# Patient Record
Sex: Male | Born: 1990 | Race: Black or African American | Hispanic: No | Marital: Single | Smoking: Former smoker
Health system: Southern US, Community
[De-identification: ages and names within clinical notes are randomized; demographics above are authoritative.]

---
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
## 2009-08-29 ENCOUNTER — Emergency Department: Payer: Self-pay | Admitting: Emergency Medicine

## 2012-03-25 ENCOUNTER — Emergency Department: Payer: Self-pay | Admitting: Emergency Medicine

## 2016-02-06 ENCOUNTER — Emergency Department: Payer: Self-pay

## 2016-02-06 ENCOUNTER — Encounter: Payer: Self-pay | Admitting: Emergency Medicine

## 2016-02-06 ENCOUNTER — Emergency Department
Admission: EM | Admit: 2016-02-06 | Discharge: 2016-02-06 | Disposition: A | Discharge status: Home / Self Care | Payer: Self-pay | Attending: Emergency Medicine | Admitting: Emergency Medicine

## 2016-02-06 DIAGNOSIS — F17210 Nicotine dependence, cigarettes, uncomplicated: Secondary | ICD-10-CM | POA: Insufficient documentation

## 2016-02-06 DIAGNOSIS — M25552 Pain in left hip: Secondary | ICD-10-CM | POA: Insufficient documentation

## 2016-02-06 DIAGNOSIS — G8929 Other chronic pain: Secondary | ICD-10-CM

## 2016-02-06 MED ORDER — ETODOLAC 400 MG PO TABS: 400.0000 mg | ORAL_TABLET | Freq: Two times a day (BID) | ORAL | Status: DC

## 2016-02-06 NOTE — ED Notes (Signed)
Having pain to left hip area for about 1 week  States pain is mainly at ant/lateral left hip area. States pain radiates occasionally to lower back and upper leg  Area non tender with palpation butpain increases with standing

## 2016-02-06 NOTE — ED Notes (Signed)
Pt to ed with c/o left hip pain x 1 1/2 weeks,  Progressively getting worse.  Pt denies injury.

## 2016-02-06 NOTE — Discharge Instructions (Signed)
Call and make an appointment with the orthopedist listed on your papers. Dr[facility]. Begin taking etodolac 400 mg twice a day with food. This to be taken daily.

## 2016-02-06 NOTE — ED Provider Notes (Signed)
[facility] Emergency Department Provider Note   ____________________________________________  Time seen: Approximately 11:37 AM  I have reviewed the triage vital signs and the nursing notes.   HISTORY  Chief Complaint Hip Pain   HPI Todd Wright is a 25 y.o. male who arrives to the emergency room with complaint of chronic L hip pain that has been exacerbated 1.5 weeks. He says he has anterior hip an that radiates down the front of the thigh and through to the buttocks. Pain began years ago but he says it was just uncomfortable but within the last 1.5 weeks has become almost unbearable. 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 Pain is exacerbated by continuous movement and walking and relieved by rest. He has not tried any medications for relief of pain or been seen by any other providers. Describes the pain as a sharp, aching pain that he feels is in his bone. Admits to some weakness with walking. Denies numbness, paraesthesias, loss of bowel or bladder function and nausea and vomiting. Currently he rates his pain as an 8 of 10.    History reviewed. No pertinent past medical history.  There are no active problems to display for this patient.   History reviewed. No pertinent past surgical history.  Current Outpatient Rx  Name  Route  Sig  Dispense  Refill  . etodolac (LODINE) 400 MG tablet   Oral   Take 1 tablet (400 mg total) by mouth 2 (two) times daily.   20 tablet   0     Allergies Review of patient's allergies indicates no known allergies.  History reviewed. No pertinent family history.  Social History Social History  Substance Use Topics  . Smoking status: Current Every Day Smoker    Types: Cigarettes  . Smokeless tobacco: None  . Alcohol Use: No    Review of Systems Constitutional: No fever/chills Cardiovascular:  Denies chest pain. Respiratory: Denies shortness of breath. Gastrointestinal: No abdominal pain.  No nausea, no vomiting.  Musculoskeletal: Negative for back pain.Positive for chronic left hip pain. Skin: Negative for rash. Neurological: Negative for headaches, focal weakness or numbness.  10-point ROS otherwise negative.  ____________________________________________   PHYSICAL EXAM:  VITAL SIGNS: ED Triage Vitals  Enc Vitals Group     BP 02/06/16 1100 140/70 mmHg     Pulse Rate 02/06/16 1100 87     Resp 02/06/16 1100 20     Temp 02/06/16 1100 98.2 F (36.8 C)     Temp Source 02/06/16 1100 Oral     SpO2 02/06/16 1100 98 %     Weight 02/06/16 1100 247 lb (112.038 kg)     Height 02/06/16 1100  (1.854 m)     Head Cir --      Peak Flow --      Pain Score 02/06/16 1059 8     Pain Loc --      Pain Edu? --      Excl. in GC? --     Constitutional: Alert and oriented. Well appearing and in no acute distress. Eyes: Conjunctivae are normal. PERRL. EOMI. Head: Atraumatic. Nose: No congestion/rhinnorhea. Neck: No stridor.   Cardiovascular: Normal rate, regular rhythm. Grossly normal heart sounds.  Good peripheral circulation. Respiratory: Normal respiratory effort.  No retractions. Lungs CTAB. Gastrointestinal: Soft and nontender. No distention.  Musculoskeletal: Examination of the left hip there is no  gross deformity. There is some soft tissue tenderness on palpation lateral aspect and slightly posteriorly but no difficulty with range of motion or crepitus was noted. There is no erythema or ecchymosis present. Patient is able to abduct and abduct. There is no swelling at the site. Neurologic:  Normal speech and language. No gross focal neurologic deficits are appreciated. No gait instability. Skin:  Skin is warm, dry and intact. No rash noted. Psychiatric: Mood and affect are normal. Speech and behavior are normal.  ____________________________________________   LABS (all  labs ordered are listed, but only abnormal results are displayed)  Labs Reviewed - No data to display  RADIOLOGY  X-ray of the left hip per radiologist was negative. I, Tommi Rumpshonda L Monserath Neff, personally viewed and evaluated these images (plain radiographs) as part of my medical decision making, as well as reviewing the written report by the radiologist. ____________________________________________   PROCEDURES  Procedure(s) performed: None  Critical Care performed: No  ____________________________________________   INITIAL IMPRESSION / ASSESSMENT AND PLAN / ED COURSE  Pertinent labs & imaging results that were available during my care of the patient were reviewed by me and considered in my medical decision making (see chart for details).  Patient was to follow up with Dr. Joice LoftsPoggi and information for contact was given to the patient. Patient was also discharged with prescription for etodolac 400 mg twice a day with food. Patient was encouraged to use ice to his hip as needed for pain and inflammation. Patient voiced that he needed a note to show his boss that he was in the emergency room. ____________________________________________   FINAL CLINICAL IMPRESSION(S) / ED DIAGNOSES  Final diagnoses:  Chronic left hip pain      NEW MEDICATIONS STARTED DURING THIS VISIT:  Discharge Medication List as of 02/06/2016  1:04 PM    START taking these medications   Details  etodolac (LODINE) 400 MG tablet Take 1 tablet (400 mg total) by mouth 2 (two) times daily., Starting 02/06/2016, Until Discontinued, Print         Note:  This document was prepared using Dragon voice recognition software and may include unintentional dictation errors.    Tommi Rumpshonda L Samier Jaco, PA-C 02/06/16 1315

## 2018-04-26 ENCOUNTER — Emergency Department
Admission: EM | Admit: 2018-04-26 | Discharge: 2018-04-26 | Disposition: A | Discharge status: Home / Self Care | Payer: Self-pay | Attending: Emergency Medicine | Admitting: Emergency Medicine

## 2018-04-26 ENCOUNTER — Encounter: Payer: Self-pay | Admitting: Emergency Medicine

## 2018-04-26 DIAGNOSIS — G510 Bell's palsy: Secondary | ICD-10-CM | POA: Insufficient documentation

## 2018-04-26 DIAGNOSIS — F17210 Nicotine dependence, cigarettes, uncomplicated: Secondary | ICD-10-CM | POA: Insufficient documentation

## 2018-04-26 DIAGNOSIS — Z79899 Other long term (current) drug therapy: Secondary | ICD-10-CM | POA: Insufficient documentation

## 2018-04-26 MED ORDER — PREDNISONE 10 MG PO TABS: 60.0000 mg | ORAL_TABLET | Freq: Every day | ORAL | 0 refills | Status: AC

## 2018-04-26 MED ORDER — ARTIFICIAL TEARS OPHTHALMIC OINT: TOPICAL_OINTMENT | Freq: Three times a day (TID) | OPHTHALMIC | 0 refills | Status: AC

## 2018-04-26 MED ORDER — VALACYCLOVIR HCL 1 G PO TABS: 1000.0000 mg | ORAL_TABLET | Freq: Three times a day (TID) | ORAL | 0 refills | Status: AC

## 2018-04-26 NOTE — ED Triage Notes (Signed)
Patient presents to the ED with numbness to the right side of his face x 2 days.  Smile is very uneven.  Patient is having difficulty closing his right eye.  Patient denies any weakness in his right arm or leg.

## 2018-04-26 NOTE — ED Provider Notes (Signed)
[facility] Emergency Department Provider Note   ____________________________________________    I have reviewed the triage vital signs and the nursing notes.   HISTORY  Chief Complaint Facial Droop     HPI Todd Wright is a 27 y.o. male who presents with right-sided facial droop.  Patient reports yesterday morning he woke up in the right side of his face "felt weird ".  He noted that he could not fully close his right eye and that the right side of his mouth was not equal when he smiled.  He denies headache.  No other neuro deficits.  No trauma.  No past medical history.  No history of high blood pressure.   History reviewed. No pertinent past medical history.  There are no active problems to display for this patient.   History reviewed. No pertinent surgical history.  Prior to Admission medications   Medication Sig Start Date End Date Taking? Authorizing Provider  artificial tears (LACRILUBE) OINT ophthalmic ointment Place into the right eye 3 (three) times daily for 14 days. 04/26/18 05/10/18  Jene Every, MD  etodolac (LODINE) 400 MG tablet Take 1 tablet (400 mg total) by mouth 2 (two) times daily. 02/06/16   Tommi Rumps, PA-C  predniSONE (DELTASONE) 10 MG tablet Take 6 tablets (60 mg total) by mouth daily with breakfast for 7 days. 04/26/18 05/03/18  Jene Every, MD  valACYclovir (VALTREX) 1000 MG tablet Take 1 tablet (1,000 mg total) by mouth 3 (three) times daily for 7 days. 04/26/18 05/03/18  Jene Every, MD     Allergies Patient has no known allergies.  No family history on file.  Social History Social History   Tobacco Use  . Smoking status: Current Every Day Smoker    Packs/day: 0.25    Types: Cigarettes  . Smokeless tobacco: Never Used  Substance Use Topics  . Alcohol use: No  . Drug use: No    Review of Systems  Constitutional: No fever/chills Eyes: No visual changes.  Difficulty closing eye fully ENT: As  above.   Gastrointestinal: No nausea, no vomiting.    Musculoskeletal: Negative for back pain. Skin: Negative for rash. Neurological: As above   ____________________________________________   PHYSICAL EXAM:  VITAL SIGNS: ED Triage Vitals [04/26/18 1726]  Enc Vitals Group     BP 135/68     Pulse Rate 97     Resp 18     Temp 98.8 F (37.1 C)     Temp Source Oral     SpO2 98 %     Weight 108.9 kg (240 lb)     Height 1.854 m (6\' 1" )     Head Circumference      Peak Flow      Pain Score 0     Pain Loc      Pain Edu?      Excl. in GC?     Constitutional: Alert and oriented.  Eyes: Conjunctivae are normal.  Ptosis of the right eye, mild   Mouth/Throat: Mucous membranes are moist.  No difficulty swallowing, pharynx normal  Cardiovascular: Normal rate, regular rhythm. Grossly normal heart sounds.  Good peripheral circulation. Respiratory: Normal respiratory effort.  No retractions.  Gastrointestinal: Soft and nontender. No distention.   Musculoskeletal: Warm and well perfused Neurologic:  Normal speech and language.  Right cranial nerve VII palsy, including forehead consistent with Bell's palsy Skin:  Skin is warm, dry and intact. No rash noted. Psychiatric: Mood and affect are  normal. Speech and behavior are normal.  ____________________________________________   LABS (all labs ordered are listed, but only abnormal results are displayed)  Labs Reviewed - No data to display ____________________________________________  EKG  None ____________________________________________  RADIOLOGY  None ____________________________________________   PROCEDURES  Procedure(s) performed: No  Procedures   Critical Care performed: No ____________________________________________   INITIAL IMPRESSION / ASSESSMENT AND PLAN / ED COURSE  Pertinent labs & imaging results that were available during my care of the patient were reviewed by me and considered in my medical  decision making (see chart for details).  Healthy 27 year old male with unremarkable vital signs presents with right-sided Bell's palsy.  No concerning additional neurological deficits or symptoms.  Will prescribe Lacri-Lube, Valtrex and steroids, outpatient follow-up with neurology    ____________________________________________   FINAL CLINICAL IMPRESSION(S) / ED DIAGNOSES  Final diagnoses:  Bell's palsy        Note:  This document was prepared using Dragon voice recognition software and may include unintentional dictation errors.    Jene Every, MD 04/26/18 [phone]

## 2018-06-09 ENCOUNTER — Encounter: Payer: Self-pay | Admitting: Emergency Medicine

## 2018-06-09 ENCOUNTER — Inpatient Hospital Stay
Admission: EM | Admit: 2018-06-09 | Discharge: 2018-06-10 | DRG: 896 | Disposition: A | Discharge status: Home / Self Care | Payer: Self-pay | Attending: Internal Medicine | Admitting: Internal Medicine

## 2018-06-09 ENCOUNTER — Emergency Department: Payer: Self-pay

## 2018-06-09 ENCOUNTER — Other Ambulatory Visit: Payer: Self-pay

## 2018-06-09 DIAGNOSIS — T50902A Poisoning by unspecified drugs, medicaments and biological substances, intentional self-harm, initial encounter: Secondary | ICD-10-CM

## 2018-06-09 DIAGNOSIS — F17210 Nicotine dependence, cigarettes, uncomplicated: Secondary | ICD-10-CM | POA: Diagnosis present

## 2018-06-09 DIAGNOSIS — T50904A Poisoning by unspecified drugs, medicaments and biological substances, undetermined, initial encounter: Secondary | ICD-10-CM | POA: Diagnosis present

## 2018-06-09 DIAGNOSIS — F1410 Cocaine abuse, uncomplicated: Secondary | ICD-10-CM

## 2018-06-09 DIAGNOSIS — F1210 Cannabis abuse, uncomplicated: Secondary | ICD-10-CM | POA: Diagnosis present

## 2018-06-09 DIAGNOSIS — E876 Hypokalemia: Secondary | ICD-10-CM | POA: Diagnosis present

## 2018-06-09 DIAGNOSIS — Z79899 Other long term (current) drug therapy: Secondary | ICD-10-CM

## 2018-06-09 DIAGNOSIS — F1994 Other psychoactive substance use, unspecified with psychoactive substance-induced mood disorder: Secondary | ICD-10-CM

## 2018-06-09 DIAGNOSIS — N179 Acute kidney failure, unspecified: Secondary | ICD-10-CM | POA: Diagnosis present

## 2018-06-09 DIAGNOSIS — Z599 Problem related to housing and economic circumstances, unspecified: Secondary | ICD-10-CM

## 2018-06-09 DIAGNOSIS — T50914A Poisoning by multiple unspecified drugs, medicaments and biological substances, undetermined, initial encounter: Secondary | ICD-10-CM | POA: Diagnosis present

## 2018-06-09 DIAGNOSIS — F1414 Cocaine abuse with cocaine-induced mood disorder: Principal | ICD-10-CM | POA: Diagnosis present

## 2018-06-09 DIAGNOSIS — F1010 Alcohol abuse, uncomplicated: Secondary | ICD-10-CM | POA: Diagnosis present

## 2018-06-09 DIAGNOSIS — T50901A Poisoning by unspecified drugs, medicaments and biological substances, accidental (unintentional), initial encounter: Secondary | ICD-10-CM | POA: Diagnosis present

## 2018-06-09 DIAGNOSIS — G92 Toxic encephalopathy: Secondary | ICD-10-CM | POA: Diagnosis present

## 2018-06-09 LAB — PROTIME-INR
INR: 0.95
Prothrombin Time: 12.6 seconds (ref 11.4–15.2)

## 2018-06-09 LAB — URINALYSIS, COMPLETE (UACMP) WITH MICROSCOPIC
BACTERIA UA: NONE SEEN
BILIRUBIN URINE: NEGATIVE
Glucose, UA: NEGATIVE mg/dL
Hgb urine dipstick: NEGATIVE
KETONES UR: 20 mg/dL — AB
Leukocytes, UA: NEGATIVE
Nitrite: NEGATIVE
Protein, ur: NEGATIVE mg/dL
Specific Gravity, Urine: 1.012 (ref 1.005–1.030)
pH: 6 (ref 5.0–8.0)

## 2018-06-09 LAB — CBC WITH DIFFERENTIAL/PLATELET
Abs Immature Granulocytes: 0.01 10*3/uL (ref 0.00–0.07)
BASOS ABS: 0 10*3/uL (ref 0.0–0.1)
Basophils Relative: 0 %
EOS ABS: 0.1 10*3/uL (ref 0.0–0.5)
EOS PCT: 1 %
HEMATOCRIT: 46.2 % (ref 39.0–52.0)
Hemoglobin: 15.1 g/dL (ref 13.0–17.0)
Immature Granulocytes: 0 %
LYMPHS ABS: 2 10*3/uL (ref 0.7–4.0)
Lymphocytes Relative: 26 %
MCH: 29 pg (ref 26.0–34.0)
MCHC: 32.7 g/dL (ref 30.0–36.0)
MCV: 88.8 fL (ref 80.0–100.0)
Monocytes Absolute: 0.7 10*3/uL (ref 0.1–1.0)
Monocytes Relative: 9 %
NRBC: 0 % (ref 0.0–0.2)
Neutro Abs: 4.8 10*3/uL (ref 1.7–7.7)
Neutrophils Relative %: 64 %
Platelets: 246 10*3/uL (ref 150–400)
RBC: 5.2 MIL/uL (ref 4.22–5.81)
RDW: 13.8 % (ref 11.5–15.5)
WBC: 7.5 10*3/uL (ref 4.0–10.5)

## 2018-06-09 LAB — URINE DRUG SCREEN, QUALITATIVE (ARMC ONLY)
AMPHETAMINES, UR SCREEN: POSITIVE — AB
BENZODIAZEPINE, UR SCRN: NOT DETECTED
Barbiturates, Ur Screen: NOT DETECTED
COCAINE METABOLITE, UR ~~LOC~~: POSITIVE — AB
Cannabinoid 50 Ng, Ur ~~LOC~~: POSITIVE — AB
MDMA (Ecstasy)Ur Screen: NOT DETECTED
Methadone Scn, Ur: NOT DETECTED
OPIATE, UR SCREEN: NOT DETECTED
PHENCYCLIDINE (PCP) UR S: NOT DETECTED
Tricyclic, Ur Screen: NOT DETECTED

## 2018-06-09 LAB — BASIC METABOLIC PANEL
Anion gap: 12 (ref 5–15)
BUN: 8 mg/dL (ref 6–20)
CALCIUM: 9.3 mg/dL (ref 8.9–10.3)
CO2: 25 mmol/L (ref 22–32)
CREATININE: 1.49 mg/dL — AB (ref 0.61–1.24)
Chloride: 101 mmol/L (ref 98–111)
GFR calc non Af Amer: >60 mL/min (ref >60)
Glucose, Bld: 105 mg/dL — ABNORMAL HIGH (ref 70–99)
Potassium: 3.3 mmol/L — ABNORMAL LOW (ref 3.5–5.1)
SODIUM: 138 mmol/L (ref 135–145)

## 2018-06-09 LAB — ETHANOL: Alcohol, Ethyl (B): <10 mg/dL (ref <10)

## 2018-06-09 LAB — HEPATIC FUNCTION PANEL
ALK PHOS: 66 U/L (ref 38–126)
ALT: 21 U/L (ref 0–44)
AST: 32 U/L (ref 15–41)
Albumin: 4.4 g/dL (ref 3.5–5.0)
BILIRUBIN INDIRECT: 1.2 mg/dL — AB (ref 0.3–0.9)
Bilirubin, Direct: 0.1 mg/dL (ref 0.0–0.2)
TOTAL PROTEIN: 7.8 g/dL (ref 6.5–8.1)
Total Bilirubin: 1.3 mg/dL — ABNORMAL HIGH (ref 0.3–1.2)

## 2018-06-09 LAB — ACETAMINOPHEN LEVEL
Acetaminophen (Tylenol), Serum: <10 ug/mL — ABNORMAL LOW (ref 10–30)
Acetaminophen (Tylenol), Serum: <10 ug/mL — ABNORMAL LOW (ref 10–30)

## 2018-06-09 LAB — TROPONIN I: Troponin I: <0.03 ng/mL (ref <0.03)

## 2018-06-09 LAB — SALICYLATE LEVEL: Salicylate Lvl: <7 mg/dL (ref 2.8–30.0)

## 2018-06-09 MED ORDER — DEXTROSE 5 % IV SOLN
15.0000 mg/kg/h | INTRAVENOUS | Status: DC
  Administered 2018-06-09: 15 mg/kg/h via INTRAVENOUS
  Filled 2018-06-09 (×2): qty 200

## 2018-06-09 MED ORDER — ACETYLCYSTEINE LOAD VIA INFUSION
150.0000 mg/kg | Freq: Once | INTRAVENOUS | Status: AC
  Administered 2018-06-09: 15750 mg via INTRAVENOUS
  Filled 2018-06-09: qty 394

## 2018-06-09 MED ORDER — DIPHENHYDRAMINE HCL 50 MG/ML IJ SOLN
50.0000 mg | Freq: Once | INTRAMUSCULAR | Status: AC
  Administered 2018-06-09: 50 mg via INTRAVENOUS

## 2018-06-09 MED ORDER — DIPHENHYDRAMINE HCL 50 MG/ML IJ SOLN
INTRAMUSCULAR | Status: AC
  Filled 2018-06-09: qty 1

## 2018-06-09 MED ORDER — LORAZEPAM 2 MG/ML IJ SOLN
2.0000 mg | Freq: Once | INTRAMUSCULAR | Status: AC
  Administered 2018-06-09: 2 mg via INTRAVENOUS

## 2018-06-09 MED ORDER — SODIUM CHLORIDE 0.9 % IV BOLUS
1000.0000 mL | Freq: Once | INTRAVENOUS | Status: AC
  Administered 2018-06-09: 1000 mL via INTRAVENOUS

## 2018-06-09 NOTE — ED Notes (Signed)
Pt returned from CT °

## 2018-06-09 NOTE — ED Notes (Signed)
Pt went to CT accompanied by security and RN.
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
## 2018-06-09 NOTE — ED Notes (Addendum)
Pt noted with itching and hives.  Dr Marisa Severin notified. Mucomyst paused until MD eval. Pt very agitated at this time.

## 2018-06-09 NOTE — ED Notes (Signed)
Per dr Marisa Severin will remain to leave mucomyst paused until repeat tylenol level.

## 2018-06-09 NOTE — BH Assessment (Signed)
Assessment Note  Todd Wright is an 27 y.o. male.   Diagnosis: Overdose  Past Medical History: History reviewed. No pertinent past medical history.  History reviewed. No pertinent surgical history.  Family History: History reviewed. No pertinent family history.  Social History:  reports that he has been smoking cigarettes. He has been smoking about 0.25 packs per day. He has never used smokeless tobacco. He reports that he does not drink alcohol or use drugs.  Additional Social History:     CIWA: CIWA-Ar BP: 125/70 Pulse Rate: 82 COWS:    Allergies: No Known Allergies  Home Medications:  (Not in a hospital admission)  OB/GYN Status:  No LMP for male patient.  General Assessment Data Assessment unable to be completed: Yes Reason for not completing assessment: Pt here for an overdose and is not yet medically cleared or appropriate for assessment                                                  Advance Directives (For Healthcare) Does Patient Have a Medical Advance Directive?: (uta)          Disposition: No disposition at this time due to pt not being medically cleared.     On Site Evaluation by:   Reviewed with Physician:    Shylynn Bruning D Nyhla Mountjoy 06/09/2018 9:20 PM
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
## 2018-06-09 NOTE — ED Notes (Signed)
In and out cath by stephen RN with this RN, sitter, and lisa NT at bedside as well as security. Sterile technique maintained

## 2018-06-09 NOTE — ED Notes (Signed)
Eyes rolled back and body have mild twitching similar to seizure like activity. Dr Marisa Severin at bedside.
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
## 2018-06-10 ENCOUNTER — Other Ambulatory Visit: Payer: Self-pay

## 2018-06-10 DIAGNOSIS — F1994 Other psychoactive substance use, unspecified with psychoactive substance-induced mood disorder: Secondary | ICD-10-CM

## 2018-06-10 DIAGNOSIS — F1410 Cocaine abuse, uncomplicated: Secondary | ICD-10-CM

## 2018-06-10 LAB — CBC
HCT: 44.5 % (ref 39.0–52.0)
Hemoglobin: 14.2 g/dL (ref 13.0–17.0)
MCH: 29 pg (ref 26.0–34.0)
MCHC: 31.9 g/dL (ref 30.0–36.0)
MCV: 90.8 fL (ref 80.0–100.0)
NRBC: 0 % (ref 0.0–0.2)
PLATELETS: 238 10*3/uL (ref 150–400)
RBC: 4.9 MIL/uL (ref 4.22–5.81)
RDW: 13.6 % (ref 11.5–15.5)
WBC: 8.4 10*3/uL (ref 4.0–10.5)

## 2018-06-10 LAB — BASIC METABOLIC PANEL
Anion gap: 11 (ref 5–15)
BUN: 8 mg/dL (ref 6–20)
CALCIUM: 8.5 mg/dL — AB (ref 8.9–10.3)
CO2: 26 mmol/L (ref 22–32)
CREATININE: 1.09 mg/dL (ref 0.61–1.24)
Chloride: 104 mmol/L (ref 98–111)
GFR calc non Af Amer: >60 mL/min (ref >60)
Glucose, Bld: 77 mg/dL (ref 70–99)
Potassium: 3.3 mmol/L — ABNORMAL LOW (ref 3.5–5.1)
Sodium: 141 mmol/L (ref 135–145)

## 2018-06-10 MED ORDER — LORAZEPAM 2 MG/ML IJ SOLN: 2.0000 mg | INTRAMUSCULAR | Status: DC | PRN

## 2018-06-10 MED ORDER — POTASSIUM CHLORIDE CRYS ER 20 MEQ PO TBCR
40.0000 meq | EXTENDED_RELEASE_TABLET | Freq: Once | ORAL | Status: AC
  Administered 2018-06-10: 40 meq via ORAL
  Filled 2018-06-10: qty 2

## 2018-06-10 MED ORDER — ADULT MULTIVITAMIN W/MINERALS CH
1.0000 | ORAL_TABLET | Freq: Every day | ORAL | Status: DC
  Administered 2018-06-10: 1 via ORAL
  Filled 2018-06-10: qty 1

## 2018-06-10 MED ORDER — LORAZEPAM 2 MG/ML IJ SOLN: 1.0000 mg | Freq: Four times a day (QID) | INTRAMUSCULAR | Status: DC | PRN

## 2018-06-10 MED ORDER — FOLIC ACID 1 MG PO TABS
1.0000 mg | ORAL_TABLET | Freq: Every day | ORAL | Status: DC
  Administered 2018-06-10: 1 mg via ORAL
  Filled 2018-06-10: qty 1

## 2018-06-10 MED ORDER — LORAZEPAM 2 MG/ML IJ SOLN
0.0000 mg | Freq: Four times a day (QID) | INTRAMUSCULAR | Status: DC
  Administered 2018-06-10: 2 mg via INTRAVENOUS
  Filled 2018-06-10: qty 1

## 2018-06-10 MED ORDER — THIAMINE HCL 100 MG/ML IJ SOLN: 100.0000 mg | Freq: Every day | INTRAMUSCULAR | Status: DC

## 2018-06-10 MED ORDER — ONDANSETRON HCL 4 MG/2ML IJ SOLN: 4.0000 mg | Freq: Four times a day (QID) | INTRAMUSCULAR | Status: DC | PRN

## 2018-06-10 MED ORDER — LORAZEPAM 2 MG/ML IJ SOLN: 0.0000 mg | Freq: Two times a day (BID) | INTRAMUSCULAR | Status: DC

## 2018-06-10 MED ORDER — LORAZEPAM 1 MG PO TABS: 1.0000 mg | ORAL_TABLET | Freq: Four times a day (QID) | ORAL | Status: DC | PRN

## 2018-06-10 MED ORDER — VITAMIN B-1 100 MG PO TABS
100.0000 mg | ORAL_TABLET | Freq: Every day | ORAL | Status: DC
  Administered 2018-06-10: 100 mg via ORAL
  Filled 2018-06-10: qty 1

## 2018-06-10 MED ORDER — SODIUM CHLORIDE 0.9 % IV SOLN
INTRAVENOUS | Status: AC
  Administered 2018-06-10: 01:00:00 via INTRAVENOUS

## 2018-06-10 MED ORDER — ENOXAPARIN SODIUM 40 MG/0.4ML ~~LOC~~ SOLN
40.0000 mg | SUBCUTANEOUS | Status: DC
  Administered 2018-06-10: 40 mg via SUBCUTANEOUS
  Filled 2018-06-10: qty 0.4

## 2018-06-10 MED ORDER — ONDANSETRON HCL 4 MG PO TABS: 4.0000 mg | ORAL_TABLET | Freq: Four times a day (QID) | ORAL | Status: DC | PRN

## 2018-06-10 NOTE — Progress Notes (Signed)
Paged Dr. Toni Amend twice, also text page about patient wanting to leava AMA. Waiting for MD to call back. RN will continue to monitor.
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
## 2018-06-10 NOTE — Progress Notes (Signed)
Patient was transferred from the ER accompanied by the care RN., NT and a bedside aide and a Emergency planning/management officer. On admission to the unit patient was somnolent and was unable to provide any admission documentation. Patient was placed on acardiac monitor,placed on suicide precaution with aide at the bedside.

## 2018-06-10 NOTE — Consult Note (Signed)
Psychiatry: Patient refused interview.  Will attempt later.

## 2018-06-10 NOTE — Progress Notes (Signed)
Dr. Toni Amend seen patient. MD clear patient to be discharge, no new medication was started and advice patient to see PCP within a week. Discontinue peripheral IV and telemetry monitor. Katie, NT transport patient.
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
## 2018-06-10 NOTE — ED Notes (Signed)
All belongings sent to floor.

## 2018-06-10 NOTE — Plan of Care (Signed)
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
## 2018-06-11 LAB — HIV ANTIBODY (ROUTINE TESTING W REFLEX): HIV Screen 4th Generation wRfx: NONREACTIVE

## 2019-01-25 ENCOUNTER — Emergency Department
Admission: EM | Admit: 2019-01-25 | Discharge: 2019-01-25 | Disposition: A | Discharge status: Home / Self Care | Payer: Self-pay | Attending: Emergency Medicine | Admitting: Emergency Medicine

## 2019-01-25 ENCOUNTER — Other Ambulatory Visit: Payer: Self-pay

## 2019-01-25 ENCOUNTER — Encounter: Payer: Self-pay | Admitting: Emergency Medicine

## 2019-01-25 DIAGNOSIS — F17210 Nicotine dependence, cigarettes, uncomplicated: Secondary | ICD-10-CM | POA: Insufficient documentation

## 2019-01-25 DIAGNOSIS — F1010 Alcohol abuse, uncomplicated: Secondary | ICD-10-CM | POA: Insufficient documentation

## 2019-01-25 DIAGNOSIS — F1994 Other psychoactive substance use, unspecified with psychoactive substance-induced mood disorder: Secondary | ICD-10-CM | POA: Insufficient documentation

## 2019-01-25 DIAGNOSIS — F1410 Cocaine abuse, uncomplicated: Secondary | ICD-10-CM | POA: Insufficient documentation

## 2019-01-25 DIAGNOSIS — Z1159 Encounter for screening for other viral diseases: Secondary | ICD-10-CM | POA: Insufficient documentation

## 2019-01-25 LAB — COMPREHENSIVE METABOLIC PANEL
ALT: 37 U/L (ref 0–44)
AST: 23 U/L (ref 15–41)
Albumin: 3.6 g/dL (ref 3.5–5.0)
Alkaline Phosphatase: 80 U/L (ref 38–126)
Anion gap: 7 (ref 5–15)
BUN: 12 mg/dL (ref 6–20)
CO2: 26 mmol/L (ref 22–32)
Calcium: 8.4 mg/dL — ABNORMAL LOW (ref 8.9–10.3)
Chloride: 109 mmol/L (ref 98–111)
Creatinine, Ser: 1.06 mg/dL (ref 0.61–1.24)
GFR calc Af Amer: >60 mL/min (ref >60)
GFR calc non Af Amer: >60 mL/min (ref >60)
Glucose, Bld: 97 mg/dL (ref 70–99)
Potassium: 3.4 mmol/L — ABNORMAL LOW (ref 3.5–5.1)
Sodium: 142 mmol/L (ref 135–145)
Total Bilirubin: 0.7 mg/dL (ref 0.3–1.2)
Total Protein: 6.6 g/dL (ref 6.5–8.1)

## 2019-01-25 LAB — URINE DRUG SCREEN, QUALITATIVE (ARMC ONLY)
Amphetamines, Ur Screen: NOT DETECTED
Barbiturates, Ur Screen: NOT DETECTED
Benzodiazepine, Ur Scrn: NOT DETECTED
Cannabinoid 50 Ng, Ur ~~LOC~~: POSITIVE — AB
Cocaine Metabolite,Ur ~~LOC~~: POSITIVE — AB
MDMA (Ecstasy)Ur Screen: NOT DETECTED
Methadone Scn, Ur: NOT DETECTED
Opiate, Ur Screen: NOT DETECTED
Phencyclidine (PCP) Ur S: NOT DETECTED
Tricyclic, Ur Screen: NOT DETECTED

## 2019-01-25 LAB — CBC
HCT: 41.6 % (ref 39.0–52.0)
Hemoglobin: 13.5 g/dL (ref 13.0–17.0)
MCH: 28.8 pg (ref 26.0–34.0)
MCHC: 32.5 g/dL (ref 30.0–36.0)
MCV: 88.7 fL (ref 80.0–100.0)
Platelets: 221 10*3/uL (ref 150–400)
RBC: 4.69 MIL/uL (ref 4.22–5.81)
RDW: 13 % (ref 11.5–15.5)
WBC: 5.8 10*3/uL (ref 4.0–10.5)
nRBC: 0 % (ref 0.0–0.2)

## 2019-01-25 LAB — SARS CORONAVIRUS 2 BY RT PCR (HOSPITAL ORDER, PERFORMED IN ~~LOC~~ HOSPITAL LAB): SARS Coronavirus 2: NEGATIVE

## 2019-01-25 LAB — ETHANOL: Alcohol, Ethyl (B): <10 mg/dL (ref <10)

## 2019-01-25 NOTE — ED Notes (Signed)
TTS at bedside. 

## 2019-01-25 NOTE — BH Assessment (Signed)
Patient signed "Release of Information" for [employer] spoke with "Brother Nyoka Cowden" about the information he needed to be cleared to be admitted to their program. ER MD (Dr. Jimmye Norman) had needed labs order.

## 2019-01-25 NOTE — ED Notes (Signed)
Pt sitting in bed in NAD speaking with this RN, A&O x4. PT reports his last drink was yesterday evening and his last cocaine use was around the same time. Pt reports he usually drinks about an 18 pk of beer daily and uses cocaine almost everytime he drinks.

## 2019-01-25 NOTE — Discharge Instructions (Addendum)
You have been seen in the emergency department for a  psychiatric concern. You have been evaluated both medically as well as psychiatrically. Please follow-up with your outpatient resources provided. Return to the emergency department for any worsening symptoms, or any thoughts of hurting yourself or anyone else so that we may attempt to help you. 

## 2019-01-25 NOTE — BH Assessment (Signed)
Assessment Note  Todd Wright is an 28 y.o. male who presents to the ER after he was advised to do so by treatment facility, [facility], to be medically cleared and screened for COVID-19. Patient reports of abusing cannabis, cocaine and alcohol. He states he used on a daily basis in varying amounts. He further reports, the last seven months have been the worse and it has gotten to the point he is unable to manage it. His use have affected his relationship with his family and friends, as well as his job.  Patient denies history of treatment for both substance abuse and mental health needs. This is the first time seeking help.  During the interview, the patient was calm, cooperative and pleasant. He was able to provide appropriate answers to the questions. He also denies involvement with the legal system and history of violence and aggression. Patient denies SI/HI and AV/H.  Diagnosis: Substance Abuse Disorder  Past Medical History: History reviewed. No pertinent past medical history.  History reviewed. No pertinent surgical history.  Family History: No family history on file.  Social History:  reports that he has been smoking cigarettes. He has been smoking about 0.25 packs per day. He has never used smokeless tobacco. He reports current alcohol use. He reports current drug use. Drug: Cocaine.  Additional Social History:  Alcohol / Drug Use Pain Medications: See PTA Prescriptions: See PTA Over the Counter: See PTA Longest period of sobriety (when/how long): "Three year" Negative Consequences of Use: Personal relationships, Work / Youth worker, Museum/gallery curator Substance #1 Name of Substance 1: Cannabis 1 - Age of First Use: 12 1 - Amount (size/oz): Unable to quantify 1 - Frequency: Daily 1 - Duration: Unable to quantify 1 - Last Use / Amount: 01/24/2019 Substance #2 Name of Substance 2: Cocaine 2 - Age of First Use: 27 2 - Amount (size/oz): "About a gram" 2 - Frequency: 4 to 5  times a week 2 - Duration: "I started about seven months ago" 2 - Last Use / Amount: 01/24/2019 Substance #3 Name of Substance 3: Alcohol  3 - Age of First Use: 01/24/2019 3 - Amount (size/oz): "About 18 pack" 3 - Frequency: Daily 3 - Duration: Unable to quantify 3 - Last Use / Amount: 01/24/2019  CIWA: CIWA-Ar BP: (!) 123/57 Pulse Rate: 76 Nausea and Vomiting: no nausea and no vomiting Tactile Disturbances: none Tremor: no tremor Auditory Disturbances: not present Paroxysmal Sweats: no sweat visible Visual Disturbances: not present Anxiety: no anxiety, at ease Headache, Fullness in Head: none present Agitation: normal activity Orientation and Clouding of Sensorium: oriented and can do serial additions CIWA-Ar Total: 0 COWS: Clinical Opiate Withdrawal Scale (COWS) Resting Pulse Rate: Pulse Rate 80 or below Sweating: No report of chills or flushing Restlessness: Able to sit still Pupil Size: Pupils pinned or normal size for room light Bone or Joint Aches: Not present Runny Nose or Tearing: Not present GI Upset: No GI symptoms Tremor: No tremor Yawning: No yawning Anxiety or Irritability: None Gooseflesh Skin: Skin is smooth COWS Total Score: 0  Allergies: No Known Allergies  Home Medications: (Not in a hospital admission)   OB/GYN Status:  No LMP for male patient.  General Assessment Data Location of Assessment: [facility] ED TTS Assessment: In system Is this a Tele or Face-to-Face Assessment?: Face-to-Face Is this an Initial Assessment or a Re-assessment for this encounter?: Initial Assessment Language Other than English: No Living Arrangements: Other (Comment)(Private Home) What gender do you identify as?: Male Marital  status: Single Pregnancy Status: No Living Arrangements: Other relatives Can pt return to current living arrangement?: Yes Admission Status: Voluntary Is patient capable of signing voluntary admission?: Yes Referral Source:  Self/Family/Friend Insurance type: None  Medical Screening Exam [facility] Walk-in ONLY) Medical Exam completed: Yes  Crisis Care Plan Living Arrangements: Other relatives Legal Guardian: Other:(Self) Name of Psychiatrist: Reports of none Name of Therapist: Reports of none  Education Status Is patient currently in school?: No Is the patient employed, unemployed or receiving disability?: Unemployed  Risk to self with the past 6 months Suicidal Ideation: No Has patient been a risk to self within the past 6 months prior to admission? : No Suicidal Intent: No Has patient had any suicidal intent within the past 6 months prior to admission? : No Is patient at risk for suicide?: No Suicidal Plan?: No Has patient had any suicidal plan within the past 6 months prior to admission? : No Access to Means: No What has been your use of drugs/alcohol within the last 12 months?: Alcohol, Cannabis & Cocaine Previous Attempts/Gestures: No How many times?: 0 Other Self Harm Risks: Active addiction Triggers for Past Attempts: None known Intentional Self Injurious Behavior: None Family Suicide History: No Recent stressful life event(s): Other (Comment), Loss (Comment)(Increase drug use) Persecutory voices/beliefs?: No Depression: Yes Depression Symptoms: Guilt, Insomnia, Isolating, Feeling worthless/self pity Substance abuse history and/or treatment for substance abuse?: Yes Suicide prevention information given to non-admitted patients: Not applicable  Risk to Others within the past 6 months Homicidal Ideation: No Does patient have any lifetime risk of violence toward others beyond the six months prior to admission? : No Thoughts of Harm to Others: No Current Homicidal Intent: No Current Homicidal Plan: No Access to Homicidal Means: No Identified Victim: Reports of none History of harm to others?: No Assessment of Violence: None Noted Violent Behavior Description: Reports of none Does patient  have access to weapons?: No Criminal Charges Pending?: No Does patient have a court date: No Is patient on probation?: No  Psychosis Hallucinations: None noted Delusions: None noted  Mental Status Report Appearance/Hygiene: Unremarkable, In scrubs Eye Contact: Good Motor Activity: Freedom of movement, Unremarkable Speech: Logical/coherent, Unremarkable Level of Consciousness: Alert Mood: Guilty, Sad, Pleasant Affect: Appropriate to circumstance, Depressed Anxiety Level: None Thought Processes: Coherent, Relevant Judgement: Unimpaired Orientation: Person, Place, Time, Situation, Appropriate for developmental age Obsessive Compulsive Thoughts/Behaviors: None  Cognitive Functioning Concentration: Normal Memory: Recent Intact, Remote Intact Is patient IDD: No Insight: Fair Impulse Control: Fair Appetite: Good Have you had any weight changes? : No Change Sleep: Decreased Total Hours of Sleep: 2 Vegetative Symptoms: None  ADLScreening [facility] Assessment Services) Patient's cognitive ability adequate to safely complete daily activities?: Yes Patient able to express need for assistance with ADLs?: Yes Independently performs ADLs?: Yes (appropriate for developmental age)  Prior Inpatient Therapy Prior Inpatient Therapy: No  Prior Outpatient Therapy Prior Outpatient Therapy: No Does patient have an ACCT team?: No Does patient have Intensive In-House Services?  : No Does patient have [facility] services? : No Does patient have P4CC services?: No  ADL Screening (condition at time of admission) Patient's cognitive ability adequate to safely complete daily activities?: Yes Is the patient deaf or have difficulty hearing?: No Does the patient have difficulty seeing, even when wearing glasses/contacts?: No Does the patient have difficulty concentrating, remembering, or making decisions?: No Patient able to express need for assistance with ADLs?: Yes Does the patient have difficulty  dressing or bathing?: No Independently performs ADLs?: Yes (  appropriate for developmental age) Does the patient have difficulty walking or climbing stairs?: No Weakness of Legs: None Weakness of Arms/Hands: None  Home Assistive Devices/Equipment Home Assistive Devices/Equipment: None  Therapy Consults (therapy consults require a physician order) PT Evaluation Needed: No OT Evalulation Needed: No SLP Evaluation Needed: No Abuse/Neglect Assessment (Assessment to be complete while patient is alone) Abuse/Neglect Assessment Can Be Completed: Yes Physical Abuse: Denies Verbal Abuse: Denies Sexual Abuse: Denies Exploitation of patient/patient's resources: Denies Self-Neglect: Denies Values / Beliefs Cultural Requests During Hospitalization: None Spiritual Requests During Hospitalization: None Consults Spiritual Care Consult Needed: No Social Work Consult Needed: No Merchant navy officerAdvance Directives (For Healthcare) Does Patient Have a Medical Advance Directive?: No       Child/Adolescent Assessment Running Away Risk: Denies(Patient is an adult)  Disposition:  Disposition Initial Assessment Completed for this Encounter: Yes  On Site Evaluation by:   Reviewed with Physician:    Lilyan Gilfordalvin J. Aiyden Lauderback MS, LCAS, [facility], NCC, CCSI Therapeutic Triage Specialist 01/25/2019 5:08 PM

## 2019-01-25 NOTE — ED Provider Notes (Signed)
Patient has been seen by psychiatry.  Patient is safe for discharge from the emergency department.  Arrange for long-term treatment for the patient, he has his lab work and hand to bring for his intake.  Patient's medical work-up largely nonrevealing besides cannabinoid and cocaine positive urine toxicology.   Harvest Dark, MD 01/25/19 [phone]

## 2019-01-25 NOTE — ED Provider Notes (Signed)
[facility] Emergency Department Provider Note       Time seen: ----------------------------------------- 1:55 PM on 01/25/2019 -----------------------------------------   I have reviewed the triage vital signs and the nursing notes.  HISTORY   Chief Complaint Psychiatric Evaluation    HPI Todd Wright is a 28 y.o. male with a history of substance-induced mood disorder, cocaine abuse, drug overdose, acute kidney injury who presents to the ED for requesting detox from alcohol and cocaine.  Patient states he last drank and used cocaine yesterday.  He has never been to treatment before he states, denies recent illness or other complaints.  History reviewed. No pertinent past medical history.  Patient Active Problem List   Diagnosis Date Noted  . Substance induced mood disorder ([place]) 06/10/2018  . Cocaine abuse ([place]) 06/10/2018  . Drug overdose, multiple drugs, undetermined intent, initial encounter 06/09/2018  . AKI (acute kidney injury) ([place]) 06/09/2018  . Drug overdose 06/09/2018    History reviewed. No pertinent surgical history.  Allergies Patient has no known allergies.  Social History Social History   Tobacco Use  . Smoking status: Current Every Day Smoker    Packs/day: 0.25    Types: Cigarettes  . Smokeless tobacco: Never Used  Substance Use Topics  . Alcohol use: Yes  . Drug use: Yes    Types: Cocaine   Review of Systems Constitutional: Negative for fever. Cardiovascular: Negative for chest pain. Respiratory: Negative for shortness of breath. Gastrointestinal: Negative for abdominal pain, vomiting and diarrhea. Musculoskeletal: Negative for back pain. Skin: Negative for rash. Neurological: Negative for headaches, focal weakness or numbness. Psychiatric: Positive for substance abuse  All systems negative/normal/unremarkable except as stated in the HPI  ____________________________________________   PHYSICAL  EXAM:  VITAL SIGNS: ED Triage Vitals  Enc Vitals Group     BP 01/25/19 1325 (!) 124/53     Pulse Rate 01/25/19 1325 81     Resp --      Temp 01/25/19 1325 98.6 F (37 C)     Temp Source 01/25/19 1325 Oral     SpO2 01/25/19 1325 99 %     Weight 01/25/19 1321 210 lb (95.3 kg)     Height 01/25/19 1321 6\' 1"  (1.854 m)     Head Circumference --      Peak Flow --      Pain Score 01/25/19 1321 0     Pain Loc --      Pain Edu? --      Excl. in [place]? --    Constitutional: Alert and oriented. Well appearing and in no distress. Eyes: Conjunctivae are normal. Normal extraocular movements. Cardiovascular: Normal rate, regular rhythm. No murmurs, rubs, or gallops. Respiratory: Normal respiratory effort without tachypnea nor retractions. Breath sounds are clear and equal bilaterally. No wheezes/rales/rhonchi. Gastrointestinal: Soft and nontender. Normal bowel sounds Musculoskeletal: Nontender with normal range of motion in extremities. No lower extremity tenderness nor edema. Neurologic:  Normal speech and language. No gross focal neurologic deficits are appreciated.  Skin:  Skin is warm, dry and intact. No rash noted. Psychiatric: Mood and affect are normal. Speech and behavior are normal.  ____________________________________________  ED COURSE:  As part of my medical decision making, I reviewed the following data within the [place] History obtained from family if available, nursing notes, old chart and ekg, as well as notes from prior ED visits. Patient presented for substance abuse requesting detox, we will assess with labs and imaging as indicated at this  time.   Procedures  Todd Wright was evaluated in Emergency Department on 01/25/2019 for the symptoms described in the history of present illness. He was evaluated in the context of the global COVID-19 pandemic, which necessitated consideration that the patient might be at risk for infection with the SARS-CoV-2  virus that causes COVID-19. Institutional protocols and algorithms that pertain to the evaluation of patients at risk for COVID-19 are in a state of rapid change based on information released by regulatory bodies including the CDC and federal and state organizations. These policies and algorithms were followed during the patient's care in the ED.  ____________________________________________   LABS (pertinent positives/negatives)  Labs Reviewed  SARS CORONAVIRUS 2 (HOSPITAL ORDER, PERFORMED IN [facility])  CBC  COMPREHENSIVE METABOLIC PANEL  ETHANOL  URINE DRUG SCREEN, QUALITATIVE ([facility] ONLY)   ____________________________________________   DIFFERENTIAL DIAGNOSIS   Alcohol abuse, cocaine abuse, depression  FINAL ASSESSMENT AND PLAN  Alcohol and cocaine abuse   Plan: The patient had presented for detox. Patient's labs are reassuring.  He appears medically clear for psychiatric evaluation and disposition.   Todd Wright , MD    Note: This note was generated in part or whole with voice recognition software. Voice recognition is usually quite accurate but there are transcription errors that can and very often do occur. I apologize for any typographical errors that were not detected and corrected.     Todd Wright,  E, MD 01/25/19 1356

## 2019-01-25 NOTE — ED Triage Notes (Signed)
Says he wants detox from etoh--last yesterday and cocaine.  He is alert and in nad, calm and coopertive with good eye contact.  Has never been in treatment before.

## 2019-02-10 ENCOUNTER — Other Ambulatory Visit: Payer: Self-pay

## 2019-02-10 ENCOUNTER — Emergency Department
Admission: EM | Admit: 2019-02-10 | Discharge: 2019-02-10 | Disposition: A | Discharge status: Home / Self Care | Payer: Self-pay | Attending: Emergency Medicine | Admitting: Emergency Medicine

## 2019-02-10 ENCOUNTER — Encounter: Payer: Self-pay | Admitting: Emergency Medicine

## 2019-02-10 DIAGNOSIS — Y999 Unspecified external cause status: Secondary | ICD-10-CM | POA: Insufficient documentation

## 2019-02-10 DIAGNOSIS — X500XXA Overexertion from strenuous movement or load, initial encounter: Secondary | ICD-10-CM | POA: Insufficient documentation

## 2019-02-10 DIAGNOSIS — S39012A Strain of muscle, fascia and tendon of lower back, initial encounter: Secondary | ICD-10-CM | POA: Insufficient documentation

## 2019-02-10 DIAGNOSIS — Y929 Unspecified place or not applicable: Secondary | ICD-10-CM | POA: Insufficient documentation

## 2019-02-10 DIAGNOSIS — Y9389 Activity, other specified: Secondary | ICD-10-CM | POA: Insufficient documentation

## 2019-02-10 DIAGNOSIS — F17210 Nicotine dependence, cigarettes, uncomplicated: Secondary | ICD-10-CM | POA: Insufficient documentation

## 2019-02-10 MED ORDER — METHOCARBAMOL 500 MG PO TABS
1000.0000 mg | ORAL_TABLET | Freq: Once | ORAL | Status: AC
  Administered 2019-02-10: 1000 mg via ORAL
  Filled 2019-02-10: qty 2

## 2019-02-10 MED ORDER — KETOROLAC TROMETHAMINE 30 MG/ML IJ SOLN
30.0000 mg | Freq: Once | INTRAMUSCULAR | Status: AC
  Administered 2019-02-10: 15:00:00 30 mg via INTRAMUSCULAR
  Filled 2019-02-10: qty 1

## 2019-02-10 MED ORDER — METHOCARBAMOL 500 MG PO TABS: ORAL_TABLET | ORAL | 0 refills | Status: DC

## 2019-02-10 MED ORDER — IBUPROFEN 600 MG PO TABS: 600.0000 mg | ORAL_TABLET | Freq: Three times a day (TID) | ORAL | 0 refills | Status: DC | PRN

## 2019-02-10 NOTE — ED Triage Notes (Signed)
Here for lower back pain. Started after lifting furniture last night. No loss bowel or bladder.  Unlabored.  VSS. Worse with certain movement. Ambulatory.

## 2019-02-10 NOTE — Discharge Instructions (Addendum)
Follow-up with your primary care provider or can no clinic acute care if any continued problems.  You may use ice or heat to your back as needed for discomfort.  Begin taking ibuprofen 600 mg every 8 hours with food.  Methocarbamol 500 mg is 1 or 2 tablets every 6 hours as needed for muscle spasms.  This medication could cause drowsiness and increase your risk for injury.  Do not take this medication and drive or operate machinery.

## 2019-02-10 NOTE — ED Notes (Signed)
Pt tolerated IM injection w/o problems

## 2019-02-10 NOTE — ED Provider Notes (Signed)
[facility] Provider Note  ____________________________________________   First MD Initiated Contact with Patient 02/10/19 1321     (approximate)  I have reviewed the triage vital signs and the nursing notes.   HISTORY  Chief Complaint Back Pain   HPI Todd Wright is a 28 y.o. male male.presents to the ED for low back pain.  Patient states he was lifting furniture last evening which he attributes to his back pain.  He denies any urinary symptoms, paresthesias or incontinence of bowel or bladder.  Pain is increased with range of motion and is better with lying down.  He denies any previous back problems.  He took Tylenol this morning with some relief.  Currently rates his pain as 9/10.      History reviewed. No pertinent past medical history.  Patient Active Problem List   Diagnosis Date Noted  . Substance induced mood disorder ([facility]) 06/10/2018  . Cocaine abuse ([facility]) 06/10/2018  . Drug overdose, multiple drugs, undetermined intent, initial encounter 06/09/2018  . AKI (acute kidney injury) ([facility]) 06/09/2018  . Drug overdose 06/09/2018    History reviewed. No pertinent surgical history.  Prior to Admission medications   Medication Sig Start Date End Date Taking? Authorizing Provider  ibuprofen (ADVIL) 600 MG tablet Take 1 tablet (600 mg total) by mouth every 8 (eight) hours as needed. 02/10/19   Tommi RumpsSummers,  L, PA-C  methocarbamol (ROBAXIN) 500 MG tablet 1 or 2 tablets every 6 hours as needed for muscle spasms. 02/10/19   Tommi RumpsSummers,  L, PA-C    Allergies Patient has no known allergies.  History reviewed. No pertinent family history.  Social History Social History   Tobacco Use  . Smoking status: Current Every Day Smoker    Packs/day: 0.25    Types: Cigarettes  . Smokeless tobacco: Never Used  Substance Use Topics  . Alcohol use: Yes  . Drug use: Yes    Types: Cocaine    Review of Systems Constitutional:  No fever/chills Eyes: No visual changes. Cardiovascular: Denies chest pain. Respiratory: Denies shortness of breath. Gastrointestinal: No abdominal pain.  No nausea, no vomiting.  Genitourinary: Negative for dysuria. Musculoskeletal: Positive for low back pain. Skin: Negative for rash. Neurological: Negative for headaches, focal weakness or numbness. ___________________________________________   PHYSICAL EXAM:  VITAL SIGNS: ED Triage Vitals  Enc Vitals Group     BP 02/10/19 1234 (!) 126/49     Pulse Rate 02/10/19 1234 93     Resp 02/10/19 1234 16     Temp 02/10/19 1234 98.3 F (36.8 C)     Temp Source 02/10/19 1234 Oral     SpO2 02/10/19 1234 97 %     Weight 02/10/19 1232 253 lb (114.8 kg)     Height 02/10/19 1232 6\' 1"  (1.854 m)     Head Circumference --      Peak Flow --      Pain Score 02/10/19 1232 9     Pain Loc --      Pain Edu? --      Excl. in [place]? --    Constitutional: Alert and oriented. Well appearing and in no acute distress. Eyes: Conjunctivae are normal.  Head: Atraumatic. Neck: No stridor.   Cardiovascular: Normal rate, regular rhythm. Grossly normal heart sounds.  Good peripheral circulation. Respiratory: Normal respiratory effort.  No retractions. Lungs CTAB. Gastrointestinal: Soft and nontender. No distention.  Musculoskeletal: On examination of the back there is no gross deformity or active  muscle spasms at this time.  Patient did have difficulty getting from a supine position to sitting without assistance.  Good muscle strength bilaterally.  Straight leg raises were negative.  Patient is able to stand and is ambulatory without any assistance.  No point tenderness on palpation of the lumbar spine but paravertebral muscles bilaterally were tender. Neurologic:  Normal speech and language. No gross focal neurologic deficits are appreciated. No gait instability. Skin:  Skin is warm, dry and intact. No rash noted. Psychiatric: Mood and affect are normal. Speech  and behavior are normal.  ____________________________________________   LABS (all labs ordered are listed, but only abnormal results are displayed)  Labs Reviewed - No data to display  PROCEDURES  Procedure(s) performed (including Critical Care):  Procedures   ____________________________________________   INITIAL IMPRESSION / ASSESSMENT AND PLAN / ED COURSE  As part of my medical decision making, I reviewed the following data within the electronic MEDICAL RECORD NUMBER Notes from prior ED visits and [place] Controlled Substance Database  28 year old male presents to the ED with complaint of low back pain after lifting heavy furniture last evening.  Patient has taken Tylenol this morning with some minimal relief of his pain.  He denies any previous back problems.  He currently denies any paresthesias, saddle anesthesias or incontinence of bowel or bladder.  Patient continues to be ambulatory without any assistance.  Physical exam is consistent with a lumbar muscle strain.  Patient was given Toradol 30 mg IM while in the department along with Robaxin 1000 mg p.o.  Patient was given a prescription for methocarbamol to continue at home along with ibuprofen 600 mg 3 times daily with food.  He is encouraged to use ice or heat to his back and follow-up with his PCP if any continued problems. ____________________________________________   FINAL CLINICAL IMPRESSION(S) / ED DIAGNOSES  Final diagnoses:  Strain of lumbar region, initial encounter     ED Discharge Orders         Ordered    methocarbamol (ROBAXIN) 500 MG tablet     02/10/19 1518    ibuprofen (ADVIL) 600 MG tablet  Every 8 hours PRN     02/10/19 1518           Note:  This document was prepared using Dragon voice recognition software and may include unintentional dictation errors.    Johnn Hai, PA-C 02/10/19 1544    Schuyler Amor, MD 02/11/19 [phone]

## 2019-06-30 ENCOUNTER — Encounter: Payer: Self-pay | Admitting: Emergency Medicine

## 2019-06-30 ENCOUNTER — Other Ambulatory Visit: Payer: Self-pay

## 2019-06-30 ENCOUNTER — Emergency Department
Admission: EM | Admit: 2019-06-30 | Discharge: 2019-06-30 | Disposition: A | Discharge status: Home / Self Care | Payer: BC Managed Care – PPO | Attending: Emergency Medicine | Admitting: Emergency Medicine

## 2019-06-30 DIAGNOSIS — J0110 Acute frontal sinusitis, unspecified: Secondary | ICD-10-CM | POA: Diagnosis not present

## 2019-06-30 DIAGNOSIS — Z87891 Personal history of nicotine dependence: Secondary | ICD-10-CM | POA: Insufficient documentation

## 2019-06-30 DIAGNOSIS — R0981 Nasal congestion: Secondary | ICD-10-CM | POA: Diagnosis present

## 2019-06-30 MED ORDER — AMOXICILLIN 875 MG PO TABS: 875.0000 mg | ORAL_TABLET | Freq: Two times a day (BID) | ORAL | 0 refills | Status: DC

## 2019-06-30 MED ORDER — OLOPATADINE HCL 0.1 % OP SOLN: 1.0000 [drp] | Freq: Two times a day (BID) | OPHTHALMIC | 1 refills | Status: AC

## 2019-06-30 MED ORDER — FEXOFENADINE-PSEUDOEPHED ER 60-120 MG PO TB12: 1.0000 | ORAL_TABLET | Freq: Two times a day (BID) | ORAL | 0 refills | Status: DC

## 2019-06-30 MED ORDER — NAPROXEN 500 MG PO TABS: 500.0000 mg | ORAL_TABLET | Freq: Two times a day (BID) | ORAL | Status: DC

## 2019-06-30 NOTE — ED Triage Notes (Signed)
C/O pressure to sinuses and left eye x 2 days.

## 2019-06-30 NOTE — ED Notes (Signed)
See triage note  Presents with sinus pressure  States pressure started between eyes and now is mainly behind left eye   Min swelling noted  Low grade fever

## 2019-06-30 NOTE — ED Provider Notes (Signed)
[facility] Emergency Department Provider Note   ____________________________________________   First MD Initiated Contact with Patient 06/30/19 [phone]     (approximate)  I have reviewed the triage vital signs and the nursing notes.   HISTORY  Chief Complaint sinus pressure    HPI Todd Wright is a 28 y.o. male patient complain of sinus congestion for 1 week.  Patient last 2 days he is having left eye pain.  Patient denies sore throat or cough.  Patient also complain of frontal headache.  Denies vision disturbance except for mild photophobia.  No vertigo or hearing loss.  Patient rates his pain as 9/10.  No palliative measure for complaint.    History reviewed. No pertinent past medical history.  Patient Active Problem List   Diagnosis Date Noted  . Substance induced mood disorder ([place]) 06/10/2018  . Cocaine abuse ([place]) 06/10/2018  . Drug overdose, multiple drugs, undetermined intent, initial encounter 06/09/2018  . AKI (acute kidney injury) ([place]) 06/09/2018  . Drug overdose 06/09/2018    History reviewed. No pertinent surgical history.  Prior to Admission medications   Medication Sig Start Date End Date Taking? Authorizing Provider  amoxicillin (AMOXIL) 875 MG tablet Take 1 tablet (875 mg total) by mouth 2 (two) times daily. 06/30/19   Sable Feil, PA-C  fexofenadine-pseudoephedrine (ALLEGRA-D) 60-120 MG 12 hr tablet Take 1 tablet by mouth 2 (two) times daily. 06/30/19   Sable Feil, PA-C  naproxen (NAPROSYN) 500 MG tablet Take 1 tablet (500 mg total) by mouth 2 (two) times daily with a meal. 06/30/19   Sable Feil, PA-C  olopatadine (PATANOL) 0.1 % ophthalmic solution Place 1 drop into both eyes 2 (two) times daily. 06/30/19 06/29/20  Sable Feil, PA-C    Allergies Patient has no known allergies.  No family history on file.  Social History Social History   Tobacco Use  . Smoking status: Former Smoker    Types:  Cigarettes  . Smokeless tobacco: Never Used  Substance Use Topics  . Alcohol use: Yes  . Drug use: Not Currently    Types: Cocaine    Review of Systems  Constitutional: No fever/chills Eyes: No visual changes.  No eye pain ENT: No sore throat.  Sinus pressure Cardiovascular: Denies chest pain. Respiratory: Denies shortness of breath. Gastrointestinal: No abdominal pain.  No nausea, no vomiting.  No diarrhea.  No constipation. Genitourinary: Negative for dysuria. Musculoskeletal: Negative for back pain. Skin: Negative for rash. Neurological: Positive for frontal headaches, but denies focal weakness or numbness. Psychiatric:  Drug abuse.   ____________________________________________   PHYSICAL EXAM:  VITAL SIGNS: ED Triage Vitals  Enc Vitals Group     BP 06/30/19 0722 (!) 127/56     Pulse Rate 06/30/19 0722 98     Resp 06/30/19 0722 16     Temp 06/30/19 0722 99 F (37.2 C)     Temp Source 06/30/19 0722 Oral     SpO2 06/30/19 0722 97 %     Weight 06/30/19 0723 262 lb (118.8 kg)     Height 06/30/19 0723 6\' 2"  (1.88 m)     Head Circumference --      Peak Flow --      Pain Score 06/30/19 0723 9     Pain Loc --      Pain Edu? --      Excl. in [place]? --     Constitutional: Alert and oriented. Well appearing and in no acute distress.  Eyes: Conjunctivae are normal. PERRL. EOMI. Head: Atraumatic. Nose: Edematous nasal turbinates.  Bilateral frontal sinus guarding. Mouth/Throat: Mucous membranes are moist.  Oropharynx non-erythematous.  Postnasal drainage. Cardiovascular: Normal rate, regular rhythm. Grossly normal heart sounds.  Good peripheral circulation. Respiratory: Normal respiratory effort.  No retractions. Lungs CTAB. Skin:  Skin is warm, dry and intact. No rash noted. Psychiatric: Mood and affect are normal. Speech and behavior are normal.  ____________________________________________   LABS (all labs ordered are listed, but only abnormal results are displayed)   Labs Reviewed - No data to display ____________________________________________  EKG   ____________________________________________  RADIOLOGY  ED MD interpretation:    Official radiology report(s): No results found.  ____________________________________________   PROCEDURES  Procedure(s) performed (including Critical Care):  Procedures   ____________________________________________   INITIAL IMPRESSION / ASSESSMENT AND PLAN / ED COURSE  As part of my medical decision making, I reviewed the following data within the electronic MEDICAL RECORD NUMBER         Todd Wright was evaluated in Emergency Department on 06/30/2019 for the symptoms described in the history of present illness. He was evaluated in the context of the global COVID-19 pandemic, which necessitated consideration that the patient might be at risk for infection with the SARS-CoV-2 virus that causes COVID-19. Institutional protocols and algorithms that pertain to the evaluation of patients at risk for COVID-19 are in a state of rapid change based on information released by regulatory bodies including the CDC and federal and state organizations. These policies and algorithms were followed during the patient's care in the ED.  Patient presents with 1 week of facial and nasal pain.  Physical exam is consistent with sinusitis.  Patient given discharge care instruction advised take medication as directed.  Patient advised [facility].      ____________________________________________   FINAL CLINICAL IMPRESSION(S) / ED DIAGNOSES  Final diagnoses:  Subacute frontal sinusitis     ED Discharge Orders         Ordered    amoxicillin (AMOXIL) 875 MG tablet  2 times daily     06/30/19 0747    fexofenadine-pseudoephedrine (ALLEGRA-D) 60-120 MG 12 hr tablet  2 times daily     06/30/19 0747    naproxen (NAPROSYN) 500 MG tablet  2 times daily with meals     06/30/19 0747    olopatadine  (PATANOL) 0.1 % ophthalmic solution  2 times daily     06/30/19 0747           Note:  This document was prepared using Dragon voice recognition software and may include unintentional dictation errors.     Joni Reining, PA-C 06/30/19 6195    Chesley Noon, MD 06/30/19 [phone]

## 2020-07-03 ENCOUNTER — Emergency Department (HOSPITAL_COMMUNITY)
Admission: EM | Admit: 2020-07-03 | Discharge: 2020-07-03 | Disposition: A | Discharge status: Home / Self Care | Payer: BC Managed Care – PPO | Attending: Emergency Medicine | Admitting: Emergency Medicine

## 2020-07-03 ENCOUNTER — Other Ambulatory Visit: Payer: Self-pay

## 2020-07-03 ENCOUNTER — Encounter (HOSPITAL_COMMUNITY): Payer: Self-pay | Admitting: Pharmacy Technician

## 2020-07-03 DIAGNOSIS — Z87891 Personal history of nicotine dependence: Secondary | ICD-10-CM | POA: Insufficient documentation

## 2020-07-03 DIAGNOSIS — M545 Low back pain, unspecified: Secondary | ICD-10-CM | POA: Insufficient documentation

## 2020-07-03 MED ORDER — METHOCARBAMOL 500 MG PO TABS: 500.0000 mg | ORAL_TABLET | Freq: Three times a day (TID) | ORAL | 0 refills | Status: AC | PRN

## 2020-07-03 MED ORDER — KETOROLAC TROMETHAMINE 15 MG/ML IJ SOLN: 15.0000 mg | Freq: Once | INTRAMUSCULAR | Status: AC

## 2020-07-03 MED ORDER — KETOROLAC TROMETHAMINE 30 MG/ML IJ SOLN
15.0000 mg | Freq: Once | INTRAMUSCULAR | Status: DC
  Filled 2020-07-03: qty 1

## 2020-07-03 MED ORDER — KETOROLAC TROMETHAMINE 15 MG/ML IJ SOLN
INTRAMUSCULAR | Status: AC
  Administered 2020-07-03: 15 mg via INTRAMUSCULAR
  Filled 2020-07-03: qty 1

## 2020-07-03 MED ORDER — MELOXICAM 7.5 MG PO TABS: 15.0000 mg | ORAL_TABLET | Freq: Every day | ORAL | 1 refills | Status: AC

## 2020-07-03 NOTE — ED Triage Notes (Signed)
Pt here with complaints of lower back pain onset approx a week ago. Pt states history of muscle spasms in the past and states this feels similar.

## 2020-07-03 NOTE — Discharge Instructions (Signed)
Take meloxicam and Robaxin for low back pain.

## 2020-07-03 NOTE — ED Provider Notes (Signed)
Emergency Department Provider Note  ____________________________________________  Time seen: Approximately 5:00 PM  I have reviewed the triage vital signs and the nursing notes.   HISTORY  Chief Complaint Back Pain   Historian Patient     HPI Todd Wright is a 29 y.o. male presents to the emergency department with acute bilateral nonradiating low back pain.  Patient states that he has had similar back pain in the past with heavy lifting.  He denies bowel or bladder incontinence or saddle anesthesia.  States that he received a muscle relaxer in the past which relieved his pain and is looking for a similar prescription.  He denies dysuria or hematuria.  No nausea or vomiting.  No other alleviating measures have been attempted.   History reviewed. No pertinent past medical history.   Immunizations up to date:  Yes.     History reviewed. No pertinent past medical history.  Patient Active Problem List   Diagnosis Date Noted   Substance induced mood disorder ([facility]) 06/10/2018   Cocaine abuse ([facility]) 06/10/2018   Drug overdose, multiple drugs, undetermined intent, initial encounter 06/09/2018   AKI (acute kidney injury) ([facility]) 06/09/2018   Drug overdose 06/09/2018    History reviewed. No pertinent surgical history.  Prior to Admission medications   Medication Sig Start Date End Date Taking? Authorizing Provider  amoxicillin (AMOXIL) 875 MG tablet Take 1 tablet (875 mg total) by mouth 2 (two) times daily. 06/30/19   Joni Reining, PA-C  fexofenadine-pseudoephedrine (ALLEGRA-D) 60-120 MG 12 hr tablet Take 1 tablet by mouth 2 (two) times daily. 06/30/19   Joni Reining, PA-C  meloxicam (MOBIC) 7.5 MG tablet Take 2 tablets (15 mg total) by mouth daily for 7 days. 07/03/20 07/10/20  Orvil Feil, PA-C  methocarbamol (ROBAXIN) 500 MG tablet Take 1 tablet (500 mg total) by mouth every 8 (eight) hours as needed for up to 5 days for muscle spasms. 07/03/20 07/08/20   Orvil Feil, PA-C  naproxen (NAPROSYN) 500 MG tablet Take 1 tablet (500 mg total) by mouth 2 (two) times daily with a meal. 06/30/19   Joni Reining, PA-C    Allergies Patient has no known allergies.  No family history on file.  Social History Social History   Tobacco Use   Smoking status: Former Smoker    Types: Cigarettes   Smokeless tobacco: Never Used  Building services engineer Use: Every day  Substance Use Topics   Alcohol use: Yes   Drug use: Not Currently    Types: Cocaine     Review of Systems  Constitutional: No fever/chills Eyes:  No discharge ENT: No upper respiratory complaints. Respiratory: no cough. No SOB/ use of accessory muscles to breath Gastrointestinal:   No nausea, no vomiting.  No diarrhea.  No constipation. Musculoskeletal: Patient has low back pain.  Skin: Negative for rash, abrasions, lacerations, ecchymosis.    ____________________________________________   PHYSICAL EXAM:  VITAL SIGNS: ED Triage Vitals  Enc Vitals Group     BP 07/03/20 1628 117/80     Pulse Rate 07/03/20 1628 76     Resp 07/03/20 1628 16     Temp 07/03/20 1628 98.8 F (37.1 C)     Temp Source 07/03/20 1628 Oral     SpO2 07/03/20 1628 99 %     Weight --      Height --      Head Circumference --      Peak Flow --  Pain Score 07/03/20 1627 8     Pain Loc --      Pain Edu? --      Excl. in [place]? --      Constitutional: Alert and oriented. Well appearing and in no acute distress. Eyes: Conjunctivae are normal. PERRL. EOMI. Head: Atraumatic. Cardiovascular: Normal rate, regular rhythm. Normal S1 and S2.  Good peripheral circulation. Respiratory: Normal respiratory effort without tachypnea or retractions. Lungs CTAB. Good air entry to the bases with no decreased or absent breath sounds Gastrointestinal: Bowel sounds x 4 quadrants. Soft and nontender to palpation. No guarding or rigidity. No distention.  No CVA tenderness. Musculoskeletal: Full range of  motion to all extremities. No obvious deformities noted.  Patient has paraspinal muscle tenderness to palpation along lumbar spine. Neurologic:  Normal for age. No gross focal neurologic deficits are appreciated.  Skin:  Skin is warm, dry and intact. No rash noted. Psychiatric: Mood and affect are normal for age. Speech and behavior are normal.   ____________________________________________   LABS (all labs ordered are listed, but only abnormal results are displayed)  Labs Reviewed - No data to display ____________________________________________  EKG   ____________________________________________  RADIOLOGY   No results found.  ____________________________________________    PROCEDURES  Procedure(s) performed:     Procedures     Medications  ketorolac (TORADOL) 30 MG/ML injection 15 mg (has no administration in time range)     ____________________________________________   INITIAL IMPRESSION / ASSESSMENT AND PLAN / ED COURSE  Pertinent labs & imaging results that were available during my care of the patient were reviewed by me and considered in my medical decision making (see chart for details).    Assessment and Plan:  Low back pain 29 year old male presents to the emergency department with bilateral low back pain that is occurred for 1 week.  Vital signs are reassuring in triage.  On physical exam, patient had some paraspinal muscle tenderness along the lumbar spine.  We will treat with IM Toradol in the emergency department and meloxicam and Robaxin at home.  A work note was provided.  All patient questions were answered.     ____________________________________________  FINAL CLINICAL IMPRESSION(S) / ED DIAGNOSES  Final diagnoses:  Acute bilateral low back pain without sciatica      NEW MEDICATIONS STARTED DURING THIS VISIT:  ED Discharge Orders         Ordered    meloxicam (MOBIC) 7.5 MG tablet  Daily        07/03/20 1658     methocarbamol (ROBAXIN) 500 MG tablet  Every 8 hours PRN        07/03/20 1658              This chart was dictated using voice recognition software/Dragon. Despite best efforts to proofread, errors can occur which can change the meaning. Any change was purely unintentional.     Orvil Feil, PA-C 07/03/20 1703    Sabino Donovan, MD 07/03/20 1800

## 2020-12-11 ENCOUNTER — Other Ambulatory Visit: Payer: Self-pay

## 2020-12-11 ENCOUNTER — Emergency Department (HOSPITAL_COMMUNITY)
Admission: EM | Admit: 2020-12-11 | Discharge: 2020-12-12 | Disposition: A | Discharge status: Home / Self Care | Payer: Self-pay | Attending: Emergency Medicine | Admitting: Emergency Medicine

## 2020-12-11 DIAGNOSIS — F332 Major depressive disorder, recurrent severe without psychotic features: Secondary | ICD-10-CM | POA: Insufficient documentation

## 2020-12-11 DIAGNOSIS — Z20822 Contact with and (suspected) exposure to covid-19: Secondary | ICD-10-CM | POA: Insufficient documentation

## 2020-12-11 DIAGNOSIS — X838XXA Intentional self-harm by other specified means, initial encounter: Secondary | ICD-10-CM | POA: Insufficient documentation

## 2020-12-11 DIAGNOSIS — F1910 Other psychoactive substance abuse, uncomplicated: Secondary | ICD-10-CM | POA: Insufficient documentation

## 2020-12-11 DIAGNOSIS — S0081XA Abrasion of other part of head, initial encounter: Secondary | ICD-10-CM | POA: Insufficient documentation

## 2020-12-11 DIAGNOSIS — Z87891 Personal history of nicotine dependence: Secondary | ICD-10-CM | POA: Insufficient documentation

## 2020-12-11 NOTE — ED Triage Notes (Signed)
Pt presents via law enforcement under IVC. Per papers patient was expressing SI to girlfriend and had intentionally hit his head on night stand. Abrasion noted to forehead. Patient denies any SI/HI at this time, also denies any alcohol or drug use. Patient does admit that he recently lost his job.

## 2020-12-11 NOTE — ED Provider Notes (Signed)
[facility]-EMERGENCY DEPT Provider Note   CSN: [id number] Arrival date & time: 12/11/20  2338     History No chief complaint on file.   Todd Wright is a 30 y.o. male.  Patient presents to the emergency department via police under IVC.  Girlfriend reportedly said that patient complained of suicidal ideation.  He denies this to me.  He states that he hit his head on a bedside table.  It is alleged that this was intentional, but patient states it was accidental.  Denies any loss of consciousness.  Denies any recent illnesses.  Denies any treatment prior to arrival.  The history is provided by the patient. No language interpreter was used.       No past medical history on file.  Patient Active Problem List   Diagnosis Date Noted  . Substance induced mood disorder ([facility]) 06/10/2018  . Cocaine abuse ([facility]) 06/10/2018  . Drug overdose, multiple drugs, undetermined intent, initial encounter 06/09/2018  . AKI (acute kidney injury) ([facility]) 06/09/2018  . Drug overdose 06/09/2018    No past surgical history on file.     No family history on file.  Social History   Tobacco Use  . Smoking status: Former Smoker    Types: Cigarettes  . Smokeless tobacco: Never Used  Vaping Use  . Vaping Use: Every day  Substance Use Topics  . Alcohol use: Yes  . Drug use: Not Currently    Types: Cocaine    Home Medications Prior to Admission medications   Medication Sig Start Date End Date Taking? Authorizing Provider  amoxicillin (AMOXIL) 875 MG tablet Take 1 tablet (875 mg total) by mouth 2 (two) times daily. 06/30/19   Joni Reining, PA-C  fexofenadine-pseudoephedrine (ALLEGRA-D) 60-120 MG 12 hr tablet Take 1 tablet by mouth 2 (two) times daily. 06/30/19   Joni Reining, PA-C  naproxen (NAPROSYN) 500 MG tablet Take 1 tablet (500 mg total) by mouth 2 (two) times daily with a meal. 06/30/19   Joni Reining, PA-C    Allergies    Patient has no known  allergies.  Review of Systems   Review of Systems  All other systems reviewed and are negative.   Physical Exam Updated Vital Signs BP 131/66 (BP Location: Right Arm)   Pulse 71   Temp 98.3 F (36.8 C) (Oral)   Resp 16   Ht 6\' 2"  (1.88 m)   Wt 113.4 kg   BMI 32.10 kg/m   Physical Exam Vitals and nursing note reviewed.  Constitutional:      Appearance: He is well-developed.  HENT:     Head: Normocephalic and atraumatic.  Eyes:     Conjunctiva/sclera: Conjunctivae normal.  Cardiovascular:     Rate and Rhythm: Normal rate and regular rhythm.     Heart sounds: No murmur heard.   Pulmonary:     Effort: Pulmonary effort is normal. No respiratory distress.     Breath sounds: Normal breath sounds.  Abdominal:     Palpations: Abdomen is soft.     Tenderness: There is no abdominal tenderness.  Musculoskeletal:     Cervical back: Neck supple.  Skin:    General: Skin is warm and dry.     Comments: Mild abrasions to forehead  Neurological:     Mental Status: He is alert and oriented to person, place, and time.  Psychiatric:        Mood and Affect: Mood normal.  Behavior: Behavior normal.     ED Results / Procedures / Treatments   Labs (all labs ordered are listed, but only abnormal results are displayed) Labs Reviewed - No data to display  EKG None  Radiology No results found.  Procedures Procedures   Medications Ordered in ED Medications - No data to display  ED Course  I have reviewed the triage vital signs and the nursing notes.  Pertinent labs & imaging results that were available during my care of the patient were reviewed by me and considered in my medical decision making (see chart for details).    MDM Rules/Calculators/A&P                          Patient here with reports suicidal thoughts.  Denies this to me.  IVC'd by police.   Appears medically clear for TTS evaluation. Final Clinical Impression(s) / ED Diagnoses Final diagnoses:   Polysubstance abuse [facility])    Rx / DC Orders ED Discharge Orders    None       Roxy Horseman, PA-C 12/12/20 0541    Palumbo, April, MD 12/12/20 [phone]

## 2020-12-12 LAB — COMPREHENSIVE METABOLIC PANEL
ALT: 38 U/L (ref 0–44)
AST: 131 U/L — ABNORMAL HIGH (ref 15–41)
Albumin: 4.8 g/dL (ref 3.5–5.0)
Alkaline Phosphatase: 62 U/L (ref 38–126)
Anion gap: 12 (ref 5–15)
BUN: 14 mg/dL (ref 6–20)
CO2: 26 mmol/L (ref 22–32)
Calcium: 9.3 mg/dL (ref 8.9–10.3)
Chloride: 99 mmol/L (ref 98–111)
Creatinine, Ser: 1.2 mg/dL (ref 0.61–1.24)
GFR, Estimated: >60 mL/min (ref >60)
Glucose, Bld: 91 mg/dL (ref 70–99)
Potassium: 3.3 mmol/L — ABNORMAL LOW (ref 3.5–5.1)
Sodium: 137 mmol/L (ref 135–145)
Total Bilirubin: 1.5 mg/dL — ABNORMAL HIGH (ref 0.3–1.2)
Total Protein: 8.3 g/dL — ABNORMAL HIGH (ref 6.5–8.1)

## 2020-12-12 LAB — CBC
HCT: 47.6 % (ref 39.0–52.0)
Hemoglobin: 15.5 g/dL (ref 13.0–17.0)
MCH: 29.3 pg (ref 26.0–34.0)
MCHC: 32.6 g/dL (ref 30.0–36.0)
MCV: 90 fL (ref 80.0–100.0)
Platelets: 239 10*3/uL (ref 150–400)
RBC: 5.29 MIL/uL (ref 4.22–5.81)
RDW: 13.3 % (ref 11.5–15.5)
WBC: 8.5 10*3/uL (ref 4.0–10.5)
nRBC: 0 % (ref 0.0–0.2)

## 2020-12-12 LAB — ACETAMINOPHEN LEVEL: Acetaminophen (Tylenol), Serum: <10 ug/mL — ABNORMAL LOW (ref 10–30)

## 2020-12-12 LAB — RESP PANEL BY RT-PCR (FLU A&B, COVID) ARPGX2
Influenza A by PCR: NEGATIVE
Influenza B by PCR: NEGATIVE
SARS Coronavirus 2 by RT PCR: NEGATIVE

## 2020-12-12 LAB — SALICYLATE LEVEL: Salicylate Lvl: <7 mg/dL — ABNORMAL LOW (ref 7.0–30.0)

## 2020-12-12 LAB — ETHANOL: Alcohol, Ethyl (B): <10 mg/dL (ref <10)
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
## 2020-12-12 NOTE — ED Notes (Signed)
Spoke with pt and pt mother about pt dc and transportation needs. Mother in route.

## 2020-12-12 NOTE — ED Notes (Signed)
Patient moved from fast track 5 to tcu 29. Sitter in sight of patient.  Patient is frustrated and asked to use the phone. Explained phone will be avaiable after 9 am. Patient is resting in bed

## 2020-12-12 NOTE — BHH Counselor (Signed)
TTS complete. Per Carolyn Coleman, PMHNP this patient does not meet in patient care criteria. Patient provided with S/A and mental health resources. [facility] ED notified in AM meeting. 

## 2020-12-12 NOTE — BH Assessment (Signed)
BHH Assessment Progress Note  Per Vernard Gambles, NP, this pt does not require psychiatric hospitalization at this time.  Pt presents under IVC initiated by a friend and upheld by EDP April Palumbo, MD, which has been rescinded by Nelly Rout, MD.  Pt is psychiatrically cleared.  Dr Lucianne Muss recommends pt for the Substance Abuse Intensive Outpatient Program offered by [facility].  This Clinical research associate has spoken to pt about the program, offering printed information about it and including further information in pt's discharge instructions.  EDP Arby Barrette, MD and pt's nurse, Dawn, have been notified.  Doylene Canning, MA Triage Specialist [phone]

## 2020-12-12 NOTE — BHH Counselor (Signed)
Patient's girlfriend, [facility], states she can pick patient up when ready for d/c- [phone]

## 2020-12-12 NOTE — Discharge Instructions (Addendum)
To help you maintain a sober lifestyle, a substance abuse treatment program may be beneficial to you.  Contact [facility] at your earliest opportunity to ask about enrolling their Substance Abuse Intensive Outpatient Program:       [facility]      [address]      [place], [place] [address]      [phone]      They also offer psychiatry/medication management and therapy.  New patients are being seen in their walk-in clinic.  Walk-in hours are Monday - Thursday from 8:00 am - 11:00 am for psychiatry, and Friday from 1:00 pm - 4:00 pm for therapy.  Walk-in patients are seen on a first come, first served basis, so try to arrive as early as possible for the best chance of being seen the same day.

## 2022-01-12 ENCOUNTER — Emergency Department (HOSPITAL_COMMUNITY): Payer: Self-pay

## 2022-01-12 ENCOUNTER — Emergency Department (HOSPITAL_COMMUNITY)
Admission: EM | Admit: 2022-01-12 | Discharge: 2022-01-12 | Disposition: A | Discharge status: Home / Self Care | Payer: Self-pay | Attending: Emergency Medicine | Admitting: Emergency Medicine

## 2022-01-12 ENCOUNTER — Other Ambulatory Visit: Payer: Self-pay

## 2022-01-12 DIAGNOSIS — Z87891 Personal history of nicotine dependence: Secondary | ICD-10-CM | POA: Insufficient documentation

## 2022-01-12 DIAGNOSIS — S0990XA Unspecified injury of head, initial encounter: Secondary | ICD-10-CM | POA: Insufficient documentation

## 2022-01-12 DIAGNOSIS — M25579 Pain in unspecified ankle and joints of unspecified foot: Secondary | ICD-10-CM | POA: Insufficient documentation
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
## 2022-01-12 NOTE — Discharge Instructions (Signed)
You were evaluated in the Emergency Department and after careful evaluation, we did not find any emergent condition requiring admission or further testing in the hospital.  Your exam/testing today is overall reassuring.  X-rays and CT scan did not show any emergent injuries.  Recommend Tylenol or Motrin for discomfort.  Please return to the Emergency Department if you experience any worsening of your condition.   Thank you for allowing [place] to be a part of your care.
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
## 2022-01-15 ENCOUNTER — Other Ambulatory Visit: Payer: Self-pay

## 2022-01-15 ENCOUNTER — Emergency Department
Admission: EM | Admit: 2022-01-15 | Discharge: 2022-01-15 | Disposition: A | Discharge status: Home / Self Care | Payer: Self-pay | Attending: Emergency Medicine | Admitting: Emergency Medicine

## 2022-01-15 ENCOUNTER — Encounter: Payer: Self-pay | Admitting: Emergency Medicine

## 2022-01-15 ENCOUNTER — Emergency Department: Payer: Self-pay

## 2022-01-15 DIAGNOSIS — S62112A Displaced fracture of triquetrum [cuneiform] bone, left wrist, initial encounter for closed fracture: Secondary | ICD-10-CM | POA: Insufficient documentation

## 2022-01-15 DIAGNOSIS — M25532 Pain in left wrist: Secondary | ICD-10-CM

## 2022-01-15 MED ORDER — METHOCARBAMOL 500 MG PO TABS
500.0000 mg | ORAL_TABLET | Freq: Once | ORAL | Status: AC
  Administered 2022-01-15: 500 mg via ORAL
  Filled 2022-01-15 (×2): qty 1

## 2022-01-15 MED ORDER — IBUPROFEN 600 MG PO TABS
600.0000 mg | ORAL_TABLET | Freq: Once | ORAL | Status: AC
  Administered 2022-01-15: 600 mg via ORAL
  Filled 2022-01-15: qty 1

## 2022-01-15 MED ORDER — ACETAMINOPHEN 500 MG PO TABS
1000.0000 mg | ORAL_TABLET | Freq: Once | ORAL | Status: AC
  Administered 2022-01-15: 1000 mg via ORAL
  Filled 2022-01-15: qty 2
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
## 2022-08-06 ENCOUNTER — Emergency Department
Admission: EM | Admit: 2022-08-06 | Discharge: 2022-08-06 | Disposition: A | Discharge status: Home / Self Care | Payer: Self-pay | Attending: Student | Admitting: Student

## 2022-08-06 ENCOUNTER — Encounter: Payer: Self-pay | Admitting: Emergency Medicine

## 2022-08-06 ENCOUNTER — Other Ambulatory Visit: Payer: Self-pay

## 2022-08-06 DIAGNOSIS — R1110 Vomiting, unspecified: Secondary | ICD-10-CM | POA: Insufficient documentation

## 2022-08-06 DIAGNOSIS — R040 Epistaxis: Secondary | ICD-10-CM | POA: Insufficient documentation

## 2022-08-06 DIAGNOSIS — R42 Dizziness and giddiness: Secondary | ICD-10-CM | POA: Insufficient documentation

## 2022-08-06 LAB — CBC WITH DIFFERENTIAL/PLATELET
Abs Immature Granulocytes: 0.01 10*3/uL (ref 0.00–0.07)
Basophils Absolute: 0 10*3/uL (ref 0.0–0.1)
Basophils Relative: 0 %
Eosinophils Absolute: 0 10*3/uL (ref 0.0–0.5)
Eosinophils Relative: 1 %
HCT: 48.6 % (ref 39.0–52.0)
Hemoglobin: 15.9 g/dL (ref 13.0–17.0)
Immature Granulocytes: 0 %
Lymphocytes Relative: 25 %
Lymphs Abs: 1.4 10*3/uL (ref 0.7–4.0)
MCH: 28.7 pg (ref 26.0–34.0)
MCHC: 32.7 g/dL (ref 30.0–36.0)
MCV: 87.7 fL (ref 80.0–100.0)
Monocytes Absolute: 0.7 10*3/uL (ref 0.1–1.0)
Monocytes Relative: 12 %
Neutro Abs: 3.6 10*3/uL (ref 1.7–7.7)
Neutrophils Relative %: 62 %
Platelets: 199 10*3/uL (ref 150–400)
RBC: 5.54 MIL/uL (ref 4.22–5.81)
RDW: 12.5 % (ref 11.5–15.5)
WBC: 5.8 10*3/uL (ref 4.0–10.5)
nRBC: 0 % (ref 0.0–0.2)

## 2022-08-06 MED ORDER — OXYMETAZOLINE HCL 0.05 % NA SOLN: 2.0000 | Freq: Two times a day (BID) | NASAL | 0 refills | Status: DC | PRN

## 2022-08-06 MED ORDER — OXYMETAZOLINE HCL 0.05 % NA SOLN
2.0000 | Freq: Once | NASAL | Status: AC
  Administered 2022-08-06: 2 via NASAL
  Filled 2022-08-06: qty 30

## 2022-08-06 NOTE — ED Triage Notes (Signed)
Pt presents via EMS with complaints of a recurrent nose bleed. Pt stated that he was seen here this AM for same and D/C with afrin. He endorses dizziness when sitting upright for too long. No bleeding noted at this time. Pt has placed Vaseline in his nose to slow the bleeding. Airway intact - respirations equal and unlabored. Denies recreational drug use, CP or SOB.

## 2022-08-06 NOTE — ED Provider Triage Note (Signed)
Emergency Medicine Provider Triage Evaluation Note  Todd Wright , a 31 y.o. male  was evaluated in triage.  Pt complains of epistaxis that began at 1120 on the left side and now right side. No trauma. Also reports weakness "from blood loss." No bleeding elsewhere or easing bruising. Denies snorting substances. Denies recent illnesses.  Review of Systems  Positive: epistaxis Negative: Easy bruising or bleeding elsewhere  Physical Exam  BP 130/73   Pulse (!) 102   Temp 98.6 F (37 C) (Oral)   Resp 20   Ht 6\' 2"  (1.88 m)   Wt 122.5 kg   SpO2 96%   BMI 34.67 kg/m  Gen:   Awake, no distress   Resp:  Normal effort  MSK:   Moves extremities without difficulty Other:  Bilateral nasal packing in place  Medical Decision Making  Medically screening exam initiated at 2:49 PM.  Appropriate orders placed.  Todd Wright was informed that the remainder of the evaluation will be completed by another provider, this initial triage assessment does not replace that evaluation, and the importance of remaining in the ED until their evaluation is complete.     Harle Stanford, PA-C 08/06/22 1452

## 2022-08-06 NOTE — ED Provider Notes (Signed)
[facility] Provider Note  Patient Contact: 3:19 PM (approximate)   History   Epistaxis   HPI  Todd Wright is a 31 y.o. male who presents emergency department complaining of bilateral epistaxis.  Patient states that he had some epistaxis starting this morning that has been ongoing.  He currently arrives with tissue stuck in both nares with no bleedthrough.  Patient states that initially he was bleeding, back of his throat and some vomiting.  Patient reports emesis contains stomach acid and small amount of blood.  No other complaints at this time.     Physical Exam   Triage Vital Signs: ED Triage Vitals  Enc Vitals Group     BP 08/06/22 1446 130/73     Pulse Rate 08/06/22 1446 (!) 102     Resp 08/06/22 1446 20     Temp 08/06/22 1446 98.6 F (37 C)     Temp Source 08/06/22 1446 Oral     SpO2 08/06/22 1446 96 %     Weight 08/06/22 1447 270 lb (122.5 kg)     Height 08/06/22 1447 6\' 2"  (1.88 m)     Head Circumference --      Peak Flow --      Pain Score 08/06/22 1447 0     Pain Loc --      Pain Edu? --      Excl. in GC? --     Most recent vital signs: Vitals:   08/06/22 1446  BP: 130/73  Pulse: (!) 102  Resp: 20  Temp: 98.6 F (37 C)  SpO2: 96%     General: Alert and in no acute distress. ENT:      Ears:       Nose: No congestion/rhinnorhea.  Patient has no ongoing epistaxis.  Some scabbing over the septum around the Kiesselbach plexus region.  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  Again no active epistaxis.      Mouth/Throat: Mucous membranes are moist.  Cardiovascular:  Good peripheral perfusion Respiratory: Normal respiratory effort without tachypnea or retractions. Lungs CTAB.  Musculoskeletal: Full range of motion to all extremities.  Neurologic:  No gross focal neurologic deficits are appreciated.  Skin:   No rash noted Other:   ED Results / Procedures / Treatments    Labs (all labs ordered are listed, but only abnormal results are displayed) Labs Reviewed  CBC WITH DIFFERENTIAL/PLATELET     EKG     RADIOLOGY    No results found.  PROCEDURES:  Critical Care performed: No  Procedures   MEDICATIONS ORDERED IN ED: Medications - No data to display   IMPRESSION / MDM / ASSESSMENT AND PLAN / ED COURSE  I reviewed the triage vital signs and the nursing notes.                              Differential diagnosis includes, but is not limited to, epistaxis, rhinitis, anterior epistaxis, posterior epistaxis  Patient's presentation is most consistent with acute presentation with potential threat to life or bodily function.   Patient's diagnosis is consistent with anterior epistaxis.  Patient presents emergency department with a nosebleed that occurred earlier today.  Patient had packing in both of his nares but there was no bleedthrough.  Visualization reveals scabbing over the anterior portion of the septum.  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  Patient will have nasal clamp provided for him should he have return of epistaxis.  Will prescribe Afrin for return of epistaxis.  Otherwise follow-up with ENT as needed.  Return precautions discussed with patient..  Patient is given ED precautions to return to the ED for any worsening or new symptoms.        FINAL CLINICAL IMPRESSION(S) / ED DIAGNOSES   Final diagnoses:  Epistaxis     Rx / DC Orders   ED Discharge Orders          Ordered    oxymetazoline (AFRIN) 0.05 % nasal spray  2 times daily PRN        08/06/22 1611             Note:  This document was prepared using Dragon voice recognition software and may include unintentional dictation errors.   Racheal Patches, PA-C 08/06/22 1611    Shaune Pollack, MD 08/06/22 [phone]

## 2022-08-06 NOTE — ED Triage Notes (Signed)
Pt states a nosebleed since 1120 today. Pt states unable to have it stop bleeding. Pt states he also vomited blood, and does not know if that was from swallowing blood. Pt states weakness due to the blood loss. Pt denies trauma to the nose.  Pt noted to have tissue towel in both nostrils and pt states blood has been coming out from both.

## 2022-08-06 NOTE — ED Notes (Signed)
Went in to given discharge instructions  Nose started bleeding again  Provider aware  Nasal clamp applied

## 2022-08-07 ENCOUNTER — Emergency Department
Admission: EM | Admit: 2022-08-07 | Discharge: 2022-08-07 | Disposition: A | Discharge status: Home / Self Care | Payer: Self-pay | Attending: Emergency Medicine | Admitting: Emergency Medicine

## 2022-08-07 DIAGNOSIS — R42 Dizziness and giddiness: Secondary | ICD-10-CM

## 2022-08-07 DIAGNOSIS — R040 Epistaxis: Secondary | ICD-10-CM

## 2022-08-07 LAB — CBC
HCT: 47 % (ref 39.0–52.0)
Hemoglobin: 15.6 g/dL (ref 13.0–17.0)
MCH: 29.1 pg (ref 26.0–34.0)
MCHC: 33.2 g/dL (ref 30.0–36.0)
MCV: 87.5 fL (ref 80.0–100.0)
Platelets: 201 10*3/uL (ref 150–400)
RBC: 5.37 MIL/uL (ref 4.22–5.81)
RDW: 12.7 % (ref 11.5–15.5)
WBC: 5.9 10*3/uL (ref 4.0–10.5)
nRBC: 0 % (ref 0.0–0.2)

## 2022-08-07 LAB — COMPREHENSIVE METABOLIC PANEL
ALT: 27 U/L (ref 0–44)
AST: 37 U/L (ref 15–41)
Albumin: 4.1 g/dL (ref 3.5–5.0)
Alkaline Phosphatase: 47 U/L (ref 38–126)
Anion gap: 9 (ref 5–15)
BUN: 14 mg/dL (ref 6–20)
CO2: 27 mmol/L (ref 22–32)
Calcium: 8.6 mg/dL — ABNORMAL LOW (ref 8.9–10.3)
Chloride: 102 mmol/L (ref 98–111)
Creatinine, Ser: 1.03 mg/dL (ref 0.61–1.24)
GFR, Estimated: >60 mL/min (ref >60)
Glucose, Bld: 97 mg/dL (ref 70–99)
Potassium: 3.1 mmol/L — ABNORMAL LOW (ref 3.5–5.1)
Sodium: 138 mmol/L (ref 135–145)
Total Bilirubin: 1.2 mg/dL (ref 0.3–1.2)
Total Protein: 7.6 g/dL (ref 6.5–8.1)

## 2022-08-07 MED ORDER — SODIUM CHLORIDE 0.9 % IV BOLUS
1000.0000 mL | Freq: Once | INTRAVENOUS | Status: AC
  Administered 2022-08-07: 1000 mL via INTRAVENOUS

## 2022-08-07 NOTE — ED Notes (Signed)
Pt Dc to home. DC instructions reviewed with all questions answered. Pt voices understanding. IV removed, cath intact, pressure dressing applied. No bleeding noted at site. Pt ambulatory out of dept with steady gait 

## 2022-08-07 NOTE — ED Provider Notes (Signed)
[facility] Provider Note    Event Date/Time   First MD Initiated Contact with Patient 08/07/22 0102     (approximate)  History   Chief Complaint: Epistaxis  HPI  Todd Wright is a 31 y.o. male with no significant past medical history presents the emergency department for epistaxis as well as dizziness.  According to the patient since yesterday morning 12/19 he has been experiencing a nosebleed.  Patient came to the emergency department for the same was given Afrin which stopped the nosebleed.  Patient states it restarted somewhat after going home.  He has been using Vaseline which has since stopped the nosebleed but the patient states he has been feeling somewhat dizzy this afternoon and evening so he came back to the emergency department for evaluation.  Overall the patient appears well, no distress.  No active bleed.  Physical Exam   Triage Vital Signs: ED Triage Vitals  Enc Vitals Group     BP 08/06/22 2203 125/82     Pulse Rate 08/06/22 2203 84     Resp 08/06/22 2203 20     Temp 08/06/22 2203 98.5 F (36.9 C)     Temp src --      SpO2 08/06/22 2203 97 %     Weight 08/06/22 2205 270 lb (122.5 kg)     Height 08/06/22 2205 6\' 2"  (1.88 m)     Head Circumference --      Peak Flow --      Pain Score 08/06/22 2205 0     Pain Loc --      Pain Edu? --      Excl. in GC? --     Most recent vital signs: Vitals:   08/06/22 2203  BP: 125/82  Pulse: 84  Resp: 20  Temp: 98.5 F (36.9 C)  SpO2: 97%    General: Awake, no distress.  CV:  Good peripheral perfusion.  Regular rate and rhythm  Resp:  Normal effort.  Equal breath sounds bilaterally.  Abd:  No distention.  Soft, nontender.  No rebound or guarding. Other:  Patient has a nasal septum perforation, some scabbing on the anterior nasal septum of the left nostril but no active bleed.   ED Results / Procedures / Treatments   EKG  EKG viewed and interpreted by myself shows a normal sinus  rhythm at 90 bpm with a narrow QRS, normal axis, normal intervals, no concerning ST changes.  MEDICATIONS ORDERED IN ED: Medications  sodium chloride 0.9 % bolus 1,000 mL (1,000 mLs Intravenous New Bag/Given 08/07/22 0122)     IMPRESSION / MDM / ASSESSMENT AND PLAN / ED COURSE  I reviewed the triage vital signs and the nursing notes.  Patient's presentation is most consistent with acute presentation with potential threat to life or bodily function.  Patient presents emergency department for a nosebleed intermittent over the past 24 hours as well as some dizziness.  Overall the patient appears well.  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  Patient does have some scabbing to the left anterior septum suspect this is likely the source of the intermittent bleed although no current bleed.  Discussed with the patient the importance of keeping it covered with Vaseline every couple hours for the next several days.  Also discussed how to use Afrin and a nasal clamp at home if needed for any rebleeding.  Given the patient's  dizziness and intermittent bleeding we will obtain a CBC and chemistry as a precaution.  EKG shows no concerning findings.  We will also IV hydrate while awaiting results.  CBC is reassuring with a normal H&H.  Chemistry is reassuring.  Given the patient's reassuring CBC and chemistry reassuring EKG we will discharge with PCP follow-up.  Patient agreeable to plan.  FINAL CLINICAL IMPRESSION(S) / ED DIAGNOSES   Epistaxis Dizziness   Note:  This document was prepared using Dragon voice recognition software and may include unintentional dictation errors.   Harvest Dark, MD 08/07/22 [phone]

## 2023-01-07 ENCOUNTER — Inpatient Hospital Stay
Admission: EM | Admit: 2023-01-07 | Discharge: 2023-01-09 | DRG: 603 | Disposition: A | Discharge status: Home / Self Care | Payer: 59 | Attending: Student in an Organized Health Care Education/Training Program | Admitting: Student in an Organized Health Care Education/Training Program

## 2023-01-07 ENCOUNTER — Encounter: Payer: Self-pay | Admitting: Emergency Medicine

## 2023-01-07 ENCOUNTER — Inpatient Hospital Stay: Payer: 59

## 2023-01-07 ENCOUNTER — Emergency Department: Payer: 59

## 2023-01-07 ENCOUNTER — Other Ambulatory Visit: Payer: Self-pay

## 2023-01-07 DIAGNOSIS — R030 Elevated blood-pressure reading, without diagnosis of hypertension: Secondary | ICD-10-CM | POA: Diagnosis not present

## 2023-01-07 DIAGNOSIS — L02519 Cutaneous abscess of unspecified hand: Secondary | ICD-10-CM | POA: Diagnosis not present

## 2023-01-07 DIAGNOSIS — Z23 Encounter for immunization: Secondary | ICD-10-CM

## 2023-01-07 DIAGNOSIS — F17290 Nicotine dependence, other tobacco product, uncomplicated: Secondary | ICD-10-CM | POA: Diagnosis present

## 2023-01-07 DIAGNOSIS — E669 Obesity, unspecified: Secondary | ICD-10-CM | POA: Diagnosis present

## 2023-01-07 DIAGNOSIS — Z6833 Body mass index (BMI) 33.0-33.9, adult: Secondary | ICD-10-CM

## 2023-01-07 DIAGNOSIS — W503XXA Accidental bite by another person, initial encounter: Secondary | ICD-10-CM | POA: Diagnosis not present

## 2023-01-07 DIAGNOSIS — L03113 Cellulitis of right upper limb: Secondary | ICD-10-CM | POA: Diagnosis not present

## 2023-01-07 DIAGNOSIS — S61451A Open bite of right hand, initial encounter: Secondary | ICD-10-CM | POA: Diagnosis not present

## 2023-01-07 DIAGNOSIS — M7989 Other specified soft tissue disorders: Secondary | ICD-10-CM | POA: Diagnosis not present

## 2023-01-07 DIAGNOSIS — S61441A Puncture wound with foreign body of right hand, initial encounter: Secondary | ICD-10-CM | POA: Diagnosis not present

## 2023-01-07 DIAGNOSIS — L02511 Cutaneous abscess of right hand: Secondary | ICD-10-CM | POA: Diagnosis not present

## 2023-01-07 DIAGNOSIS — Y92009 Unspecified place in unspecified non-institutional (private) residence as the place of occurrence of the external cause: Secondary | ICD-10-CM

## 2023-01-07 DIAGNOSIS — L03119 Cellulitis of unspecified part of limb: Secondary | ICD-10-CM | POA: Diagnosis not present

## 2023-01-07 DIAGNOSIS — M79641 Pain in right hand: Secondary | ICD-10-CM | POA: Diagnosis not present

## 2023-01-07 LAB — COMPREHENSIVE METABOLIC PANEL
ALT: 21 U/L (ref 0–44)
AST: 33 U/L (ref 15–41)
Albumin: 4.2 g/dL (ref 3.5–5.0)
Alkaline Phosphatase: 53 U/L (ref 38–126)
Anion gap: 9 (ref 5–15)
BUN: 9 mg/dL (ref 6–20)
CO2: 27 mmol/L (ref 22–32)
Calcium: 8.7 mg/dL — ABNORMAL LOW (ref 8.9–10.3)
Chloride: 103 mmol/L (ref 98–111)
Creatinine, Ser: 1.16 mg/dL (ref 0.61–1.24)
GFR, Estimated: >60 mL/min (ref >60)
Glucose, Bld: 93 mg/dL (ref 70–99)
Potassium: 3.7 mmol/L (ref 3.5–5.1)
Sodium: 139 mmol/L (ref 135–145)
Total Bilirubin: 1.1 mg/dL (ref 0.3–1.2)
Total Protein: 7.5 g/dL (ref 6.5–8.1)

## 2023-01-07 LAB — CBC WITH DIFFERENTIAL/PLATELET
Abs Immature Granulocytes: 0.03 10*3/uL (ref 0.00–0.07)
Basophils Absolute: 0 10*3/uL (ref 0.0–0.1)
Basophils Relative: 0 %
Eosinophils Absolute: 0.1 10*3/uL (ref 0.0–0.5)
Eosinophils Relative: 1 %
HCT: 46.6 % (ref 39.0–52.0)
Hemoglobin: 14.9 g/dL (ref 13.0–17.0)
Immature Granulocytes: 0 %
Lymphocytes Relative: 15 %
Lymphs Abs: 1.6 10*3/uL (ref 0.7–4.0)
MCH: 28.7 pg (ref 26.0–34.0)
MCHC: 32 g/dL (ref 30.0–36.0)
MCV: 89.8 fL (ref 80.0–100.0)
Monocytes Absolute: 0.8 10*3/uL (ref 0.1–1.0)
Monocytes Relative: 8 %
Neutro Abs: 7.8 10*3/uL — ABNORMAL HIGH (ref 1.7–7.7)
Neutrophils Relative %: 76 %
Platelets: 246 10*3/uL (ref 150–400)
RBC: 5.19 MIL/uL (ref 4.22–5.81)
RDW: 13.2 % (ref 11.5–15.5)
WBC: 10.3 10*3/uL (ref 4.0–10.5)
nRBC: 0 % (ref 0.0–0.2)

## 2023-01-07 LAB — AEROBIC/ANAEROBIC CULTURE W GRAM STAIN (SURGICAL/DEEP WOUND)

## 2023-01-07 LAB — APTT: aPTT: 31 seconds (ref 24–36)

## 2023-01-07 LAB — LACTIC ACID, PLASMA: Lactic Acid, Venous: 1.7 mmol/L (ref 0.5–1.9)

## 2023-01-07 LAB — PROTIME-INR
INR: 1 (ref 0.8–1.2)
Prothrombin Time: 13.8 seconds (ref 11.4–15.2)

## 2023-01-07 MED ORDER — ONDANSETRON HCL 4 MG/2ML IJ SOLN
4.0000 mg | Freq: Once | INTRAMUSCULAR | Status: AC
  Administered 2023-01-07: 4 mg via INTRAVENOUS
  Filled 2023-01-07: qty 2

## 2023-01-07 MED ORDER — TETANUS-DIPHTH-ACELL PERTUSSIS 5-2.5-18.5 LF-MCG/0.5 IM SUSY
0.5000 mL | PREFILLED_SYRINGE | Freq: Once | INTRAMUSCULAR | Status: AC
  Administered 2023-01-07: 0.5 mL via INTRAMUSCULAR
  Filled 2023-01-07: qty 0.5

## 2023-01-07 MED ORDER — IOHEXOL 300 MG/ML  SOLN
100.0000 mL | Freq: Once | INTRAMUSCULAR | Status: AC | PRN
  Administered 2023-01-07: 100 mL via INTRAVENOUS

## 2023-01-07 MED ORDER — VANCOMYCIN HCL IN DEXTROSE 1-5 GM/200ML-% IV SOLN
1000.0000 mg | Freq: Once | INTRAVENOUS | Status: AC
  Administered 2023-01-07: 1000 mg via INTRAVENOUS
  Filled 2023-01-07: qty 200

## 2023-01-07 MED ORDER — MORPHINE SULFATE (PF) 4 MG/ML IV SOLN
4.0000 mg | Freq: Once | INTRAVENOUS | Status: AC
  Administered 2023-01-07: 4 mg via INTRAVENOUS
  Filled 2023-01-07: qty 1

## 2023-01-07 MED ORDER — SODIUM CHLORIDE 0.9 % IV SOLN
3.0000 g | Freq: Four times a day (QID) | INTRAVENOUS | Status: DC
  Administered 2023-01-08 – 2023-01-09 (×7): 3 g via INTRAVENOUS
  Filled 2023-01-07 (×9): qty 8

## 2023-01-07 MED ORDER — LIDOCAINE HCL (PF) 1 % IJ SOLN
5.0000 mL | Freq: Once | INTRAMUSCULAR | Status: AC
  Administered 2023-01-07: 5 mL via INTRADERMAL
  Filled 2023-01-07: qty 5

## 2023-01-07 MED ORDER — HYDROCODONE-ACETAMINOPHEN 5-325 MG PO TABS
1.0000 | ORAL_TABLET | Freq: Four times a day (QID) | ORAL | Status: DC | PRN
  Administered 2023-01-08: 1 via ORAL
  Filled 2023-01-07: qty 1

## 2023-01-07 MED ORDER — ONDANSETRON HCL 4 MG/2ML IJ SOLN
4.0000 mg | Freq: Four times a day (QID) | INTRAMUSCULAR | Status: DC | PRN
  Administered 2023-01-08 (×2): 4 mg via INTRAVENOUS

## 2023-01-07 MED ORDER — ONDANSETRON HCL 4 MG PO TABS: 4.0000 mg | ORAL_TABLET | Freq: Four times a day (QID) | ORAL | Status: DC | PRN

## 2023-01-07 MED ORDER — LACTATED RINGERS IV BOLUS
1000.0000 mL | Freq: Once | INTRAVENOUS | Status: AC
  Administered 2023-01-07: 1000 mL via INTRAVENOUS

## 2023-01-07 MED ORDER — ACETAMINOPHEN 325 MG PO TABS
650.0000 mg | ORAL_TABLET | Freq: Four times a day (QID) | ORAL | Status: DC | PRN
  Administered 2023-01-08: 650 mg via ORAL
  Filled 2023-01-07: qty 2

## 2023-01-07 MED ORDER — SODIUM CHLORIDE 0.9 % IV SOLN
3.0000 g | Freq: Once | INTRAVENOUS | Status: AC
  Administered 2023-01-07: 3 g via INTRAVENOUS
  Filled 2023-01-07: qty 8

## 2023-01-07 MED ORDER — SODIUM CHLORIDE 0.9% FLUSH
3.0000 mL | Freq: Two times a day (BID) | INTRAVENOUS | Status: DC
  Administered 2023-01-07 – 2023-01-09 (×4): 3 mL via INTRAVENOUS

## 2023-01-07 MED ORDER — VANCOMYCIN HCL 1500 MG/300ML IV SOLN
1500.0000 mg | Freq: Two times a day (BID) | INTRAVENOUS | Status: DC
  Administered 2023-01-08: 1500 mg via INTRAVENOUS
  Filled 2023-01-07: qty 300

## 2023-01-07 MED ORDER — ACETAMINOPHEN 500 MG PO TABS
1000.0000 mg | ORAL_TABLET | Freq: Once | ORAL | Status: AC
  Administered 2023-01-07: 1000 mg via ORAL
  Filled 2023-01-07: qty 2

## 2023-01-07 MED ORDER — HYDROMORPHONE HCL 1 MG/ML IJ SOLN: 0.5000 mg | INTRAMUSCULAR | Status: DC | PRN

## 2023-01-07 MED ORDER — ACETAMINOPHEN 650 MG RE SUPP: 650.0000 mg | Freq: Four times a day (QID) | RECTAL | Status: DC | PRN
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
## 2023-01-08 ENCOUNTER — Inpatient Hospital Stay: Payer: 59 | Admitting: Anesthesiology

## 2023-01-08 ENCOUNTER — Other Ambulatory Visit: Payer: Self-pay

## 2023-01-08 ENCOUNTER — Encounter: Payer: Self-pay | Admitting: Internal Medicine

## 2023-01-08 ENCOUNTER — Encounter
Admission: EM | Disposition: A | Discharge status: Home / Self Care | Payer: Self-pay | Source: Home / Self Care | Attending: Student in an Organized Health Care Education/Training Program

## 2023-01-08 DIAGNOSIS — L02519 Cutaneous abscess of unspecified hand: Secondary | ICD-10-CM | POA: Diagnosis not present

## 2023-01-08 DIAGNOSIS — L03119 Cellulitis of unspecified part of limb: Secondary | ICD-10-CM | POA: Diagnosis not present

## 2023-01-08 HISTORY — PX: INCISION AND DRAINAGE: SHX5863

## 2023-01-08 LAB — BASIC METABOLIC PANEL
Anion gap: 8 (ref 5–15)
BUN: 8 mg/dL (ref 6–20)
CO2: 25 mmol/L (ref 22–32)
Calcium: 8.1 mg/dL — ABNORMAL LOW (ref 8.9–10.3)
Chloride: 106 mmol/L (ref 98–111)
Creatinine, Ser: 1.05 mg/dL (ref 0.61–1.24)
GFR, Estimated: >60 mL/min (ref >60)
Glucose, Bld: 96 mg/dL (ref 70–99)
Potassium: 3.5 mmol/L (ref 3.5–5.1)
Sodium: 139 mmol/L (ref 135–145)

## 2023-01-08 LAB — AEROBIC/ANAEROBIC CULTURE W GRAM STAIN (SURGICAL/DEEP WOUND)
Culture: NO GROWTH
Gram Stain: NONE SEEN

## 2023-01-08 LAB — CBC
HCT: 41.6 % (ref 39.0–52.0)
Hemoglobin: 13.3 g/dL (ref 13.0–17.0)
MCH: 28.7 pg (ref 26.0–34.0)
MCHC: 32 g/dL (ref 30.0–36.0)
MCV: 89.7 fL (ref 80.0–100.0)
Platelets: 229 10*3/uL (ref 150–400)
RBC: 4.64 MIL/uL (ref 4.22–5.81)
RDW: 13.1 % (ref 11.5–15.5)
WBC: 9.1 10*3/uL (ref 4.0–10.5)
nRBC: 0 % (ref 0.0–0.2)

## 2023-01-08 LAB — LACTIC ACID, PLASMA: Lactic Acid, Venous: 0.7 mmol/L (ref 0.5–1.9)

## 2023-01-08 LAB — CULTURE, BLOOD (ROUTINE X 2): Special Requests: ADEQUATE

## 2023-01-08 LAB — HIV ANTIBODY (ROUTINE TESTING W REFLEX): HIV Screen 4th Generation wRfx: NONREACTIVE

## 2023-01-08 SURGERY — INCISION AND DRAINAGE
Anesthesia: General | Site: Hand | Laterality: Right

## 2023-01-08 MED ORDER — DEXMEDETOMIDINE HCL IN NACL 200 MCG/50ML IV SOLN
INTRAVENOUS | Status: DC | PRN
  Administered 2023-01-08: 12 ug via INTRAVENOUS
  Administered 2023-01-08: 8 ug via INTRAVENOUS

## 2023-01-08 MED ORDER — PROPOFOL 10 MG/ML IV BOLUS
INTRAVENOUS | Status: DC | PRN
  Administered 2023-01-08: 200 mg via INTRAVENOUS

## 2023-01-08 MED ORDER — PHENYLEPHRINE 80 MCG/ML (10ML) SYRINGE FOR IV PUSH (FOR BLOOD PRESSURE SUPPORT)
PREFILLED_SYRINGE | INTRAVENOUS | Status: DC | PRN
  Administered 2023-01-08: 160 ug via INTRAVENOUS
  Administered 2023-01-08: 80 ug via INTRAVENOUS
  Administered 2023-01-08: 160 ug via INTRAVENOUS

## 2023-01-08 MED ORDER — PROPOFOL 10 MG/ML IV BOLUS
INTRAVENOUS | Status: AC
  Filled 2023-01-08: qty 20

## 2023-01-08 MED ORDER — FENTANYL CITRATE (PF) 100 MCG/2ML IJ SOLN
INTRAMUSCULAR | Status: AC
  Filled 2023-01-08: qty 2

## 2023-01-08 MED ORDER — DEXAMETHASONE SODIUM PHOSPHATE 10 MG/ML IJ SOLN
INTRAMUSCULAR | Status: DC | PRN
  Administered 2023-01-08: 10 mg via INTRAVENOUS

## 2023-01-08 MED ORDER — MIDAZOLAM HCL 2 MG/2ML IJ SOLN
INTRAMUSCULAR | Status: DC | PRN
  Administered 2023-01-08: 2 mg via INTRAVENOUS

## 2023-01-08 MED ORDER — LIDOCAINE HCL (CARDIAC) PF 100 MG/5ML IV SOSY
PREFILLED_SYRINGE | INTRAVENOUS | Status: DC | PRN
  Administered 2023-01-08: 100 mg via INTRAVENOUS

## 2023-01-08 MED ORDER — OXYCODONE HCL 5 MG/5ML PO SOLN: 5.0000 mg | Freq: Once | ORAL | Status: AC | PRN

## 2023-01-08 MED ORDER — FENTANYL CITRATE (PF) 100 MCG/2ML IJ SOLN
INTRAMUSCULAR | Status: DC | PRN
  Administered 2023-01-08: 100 ug via INTRAVENOUS

## 2023-01-08 MED ORDER — LACTATED RINGERS IV SOLN: INTRAVENOUS | Status: DC | PRN

## 2023-01-08 MED ORDER — VASOPRESSIN 20 UNIT/ML IV SOLN
INTRAVENOUS | Status: DC | PRN
  Administered 2023-01-08 (×2): 2 [IU] via INTRAVENOUS

## 2023-01-08 MED ORDER — SODIUM CHLORIDE 0.9 % IR SOLN
Status: DC | PRN
  Administered 2023-01-08: 502 mL

## 2023-01-08 MED ORDER — GLYCOPYRROLATE 0.2 MG/ML IJ SOLN
INTRAMUSCULAR | Status: DC | PRN
  Administered 2023-01-08: .2 mg via INTRAVENOUS

## 2023-01-08 MED ORDER — OXYCODONE HCL 5 MG PO TABS
5.0000 mg | ORAL_TABLET | Freq: Once | ORAL | Status: AC | PRN
  Administered 2023-01-08: 5 mg via ORAL

## 2023-01-08 MED ORDER — SEVOFLURANE IN SOLN
RESPIRATORY_TRACT | Status: AC
  Filled 2023-01-08: qty 250

## 2023-01-08 MED ORDER — OXYCODONE HCL 5 MG PO TABS
ORAL_TABLET | ORAL | Status: AC
  Filled 2023-01-08: qty 1

## 2023-01-08 MED ORDER — MIDAZOLAM HCL 2 MG/2ML IJ SOLN
INTRAMUSCULAR | Status: AC
  Filled 2023-01-08: qty 2

## 2023-01-08 MED ORDER — FENTANYL CITRATE (PF) 100 MCG/2ML IJ SOLN
25.0000 ug | INTRAMUSCULAR | Status: DC | PRN
  Administered 2023-01-08 (×2): 25 ug via INTRAVENOUS

## 2023-01-08 SURGICAL SUPPLY — 42 items
BNDG COHESIVE 6X5 TAN ST LF (GAUZE/BANDAGES/DRESSINGS) ×1 IMPLANT
BNDG CONFORM 6X.82 1P STRL (GAUZE/BANDAGES/DRESSINGS) IMPLANT
BNDG ELASTIC 2INX 5YD STR LF (GAUZE/BANDAGES/DRESSINGS) IMPLANT
BNDG GAUZE DERMACEA FLUFF 4 (GAUZE/BANDAGES/DRESSINGS) IMPLANT
CUFF TOURN SGL QUICK 18X4 (TOURNIQUET CUFF) IMPLANT
CUFF TOURN SGL QUICK 24 (TOURNIQUET CUFF)
CUFF TRNQT CYL 24X4X16.5-23 (TOURNIQUET CUFF) IMPLANT
DRAPE 3/4 80X56 (DRAPES) ×1 IMPLANT
DRAPE INCISE IOBAN 66X60 STRL (DRAPES) ×1 IMPLANT
DRAPE SURG 17X11 SM STRL (DRAPES) ×2 IMPLANT
DRAPE U-SHAPE 47X51 STRL (DRAPES) ×1 IMPLANT
DURAPREP 26ML APPLICATOR (WOUND CARE) ×3 IMPLANT
ELECT CAUTERY BLADE 6.4 (BLADE) ×1 IMPLANT
ELECT REM PT RETURN 9FT ADLT (ELECTROSURGICAL) ×1
ELECTRODE REM PT RTRN 9FT ADLT (ELECTROSURGICAL) ×1 IMPLANT
GAUZE SPONGE 4X4 12PLY STRL (GAUZE/BANDAGES/DRESSINGS) ×1 IMPLANT
GAUZE XEROFORM 1X8 LF (GAUZE/BANDAGES/DRESSINGS) ×1 IMPLANT
GLOVE BIOGEL PI IND STRL 9 (GLOVE) ×1 IMPLANT
GLOVE BIOGEL PI ORTHO SZ9 (GLOVE) ×6 IMPLANT
GOWN STRL REUS TWL 2XL XL LVL4 (GOWN DISPOSABLE) ×1 IMPLANT
GOWN STRL REUS W/ TWL LRG LVL3 (GOWN DISPOSABLE) ×1 IMPLANT
GOWN STRL REUS W/TWL LRG LVL3 (GOWN DISPOSABLE) ×1
HEMOVAC 400ML (MISCELLANEOUS)
KIT DRAIN HEMOVAC JP 7FR 400ML (MISCELLANEOUS) ×1 IMPLANT
KIT TURNOVER KIT A (KITS) ×1 IMPLANT
MANIFOLD NEPTUNE II (INSTRUMENTS) ×1 IMPLANT
NDL FILTER BLUNT 18X1 1/2 (NEEDLE) ×1 IMPLANT
NDL SAFETY ECLIP 18X1.5 (MISCELLANEOUS) ×1 IMPLANT
NEEDLE FILTER BLUNT 18X1 1/2 (NEEDLE) ×1 IMPLANT
NS IRRIG 500ML POUR BTL (IV SOLUTION) ×1 IMPLANT
PACK EXTREMITY ARMC (MISCELLANEOUS) ×1 IMPLANT
STAPLER SKIN PROX 35W (STAPLE) ×1 IMPLANT
SUT ETHIBOND #5 BRAIDED 30INL (SUTURE) ×1 IMPLANT
SUT ETHILON 4 0 PS 2 18 (SUTURE) IMPLANT
SUT TICRON 2-0 30IN 311381 (SUTURE) ×1 IMPLANT
SUT VIC AB 0 CT1 27 (SUTURE)
SUT VIC AB 0 CT1 27XCR 8 STRN (SUTURE) ×1 IMPLANT
SUT VIC AB 1 CTX 27 (SUTURE) ×1 IMPLANT
SYR 10ML LL (SYRINGE) ×1 IMPLANT
TAPE MICROFOAM 4IN (TAPE) ×1 IMPLANT
TRAP FLUID SMOKE EVACUATOR (MISCELLANEOUS) ×1 IMPLANT
WATER STERILE IRR 500ML POUR (IV SOLUTION) ×1 IMPLANT

## 2023-01-08 NOTE — Transfer of Care (Signed)
Immediate Anesthesia Transfer of Care Note  Patient: Todd Wright  Procedure(s) Performed: INCISION AND DRAINAGE RIGHT HAND (Right: Hand)  Patient Location: PACU  Anesthesia Type:General  Level of Consciousness: awake, drowsy, and patient cooperative  Airway & Oxygen Therapy: Patient Spontanous Breathing and Patient connected to face mask oxygen  Post-op Assessment: Report given to RN and Post -op Vital signs reviewed and stable  Post vital signs: Reviewed and stable  Last Vitals:  Vitals Value Taken Time  BP 103/72 01/08/23 1411  Temp    Pulse 82 01/08/23 1415  Resp 17 01/08/23 1415  SpO2 99 % 01/08/23 1415  Vitals shown include unvalidated device data.  Last Pain:  Vitals:   01/08/23 1015  TempSrc:   PainSc: 3          Complications: No notable events documented.
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
## 2023-01-08 NOTE — Anesthesia Procedure Notes (Signed)
Procedure Name: LMA Insertion Date/Time: 01/08/2023 1:19 PM  Performed by: Mohammed Kindle, CRNAPre-anesthesia Checklist: Patient identified, Emergency Drugs available, Suction available and Patient being monitored Patient Re-evaluated:Patient Re-evaluated prior to induction Oxygen Delivery Method: Circle system utilized Preoxygenation: Pre-oxygenation with 100% oxygen Induction Type: IV induction LMA: LMA inserted and LMA flexible inserted LMA Size: 5.0 Placement Confirmation: positive ETCO2, CO2 detector and breath sounds checked- equal and bilateral Tube secured with: Tape Dental Injury: Teeth and Oropharynx as per pre-operative assessment
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
## 2023-01-08 NOTE — Anesthesia Postprocedure Evaluation (Signed)
Anesthesia Post Note  Patient: NAKODA COLLARD  Procedure(s) Performed: INCISION AND DRAINAGE RIGHT HAND (Right: Hand)  Patient location during evaluation: PACU Anesthesia Type: General Level of consciousness: awake and alert, oriented and patient cooperative Pain management: pain level controlled Vital Signs Assessment: post-procedure vital signs reviewed and stable Respiratory status: spontaneous breathing, nonlabored ventilation and respiratory function stable Cardiovascular status: blood pressure returned to baseline and stable Postop Assessment: adequate PO intake Anesthetic complications: no   No notable events documented.   Last Vitals:  Vitals:   01/08/23 1440 01/08/23 1445  BP:  115/71  Pulse: 71 72  Resp: 17 17  Temp:  36.8 C  SpO2: 93% 93%    Last Pain:  Vitals:   01/08/23 1445  TempSrc:   PainSc: Asleep                 Reed Breech

## 2023-01-08 NOTE — Discharge Instructions (Signed)
Please complete your antibiotics and follow up with ortho surgery as directed.  Todd Wright [id number] ex #[id number]) to make a follow up in 7-10 days.  AMBULATORY SURGERY  DISCHARGE INSTRUCTIONS   The drugs that you were given will stay in your system until tomorrow so for the next 24 hours you should not:  Drive an automobile Make any legal decisions Drink any alcoholic beverage   You may resume regular meals tomorrow.  Today it is better to start with liquids and gradually work up to solid foods.  You may eat anything you prefer, but it is better to start with liquids, then soup and crackers, and gradually work up to solid foods.   Please notify your doctor immediately if you have any unusual bleeding, trouble breathing, redness and pain at the surgery site, drainage, fever, or pain not relieved by medication.    Additional Instructions:        Please contact your physician with any problems or Same Day Surgery at [phone], Monday through Friday 6 am to 4 pm, or [place] at [facility] number at [phone].
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
## 2023-01-09 ENCOUNTER — Encounter: Payer: Self-pay | Admitting: Orthopedic Surgery

## 2023-01-09 DIAGNOSIS — L02519 Cutaneous abscess of unspecified hand: Secondary | ICD-10-CM | POA: Diagnosis not present

## 2023-01-09 DIAGNOSIS — L03119 Cellulitis of unspecified part of limb: Secondary | ICD-10-CM | POA: Diagnosis not present

## 2023-01-09 LAB — AEROBIC/ANAEROBIC CULTURE W GRAM STAIN (SURGICAL/DEEP WOUND): Gram Stain: NONE SEEN

## 2023-01-09 LAB — CBC
HCT: 44 % (ref 39.0–52.0)
Hemoglobin: 14.3 g/dL (ref 13.0–17.0)
MCH: 28.4 pg (ref 26.0–34.0)
MCHC: 32.5 g/dL (ref 30.0–36.0)
MCV: 87.3 fL (ref 80.0–100.0)
Platelets: 323 10*3/uL (ref 150–400)
RBC: 5.04 MIL/uL (ref 4.22–5.81)
RDW: 12.7 % (ref 11.5–15.5)
WBC: 12.5 10*3/uL — ABNORMAL HIGH (ref 4.0–10.5)
nRBC: 0 % (ref 0.0–0.2)

## 2023-01-09 MED ORDER — AMOXICILLIN-POT CLAVULANATE 875-125 MG PO TABS: 1.0000 | ORAL_TABLET | Freq: Two times a day (BID) | ORAL | 0 refills | Status: DC

## 2023-01-09 MED ORDER — AMOXICILLIN-POT CLAVULANATE 875-125 MG PO TABS: 1.0000 | ORAL_TABLET | Freq: Two times a day (BID) | ORAL | 0 refills | Status: AC

## 2023-01-09 MED ORDER — SULFAMETHOXAZOLE-TRIMETHOPRIM 800-160 MG PO TABS: 1.0000 | ORAL_TABLET | Freq: Two times a day (BID) | ORAL | 0 refills | Status: AC

## 2023-01-09 MED ORDER — ACETAMINOPHEN 500 MG PO TABS: 1000.0000 mg | ORAL_TABLET | Freq: Four times a day (QID) | ORAL | 0 refills | Status: AC | PRN

## 2023-01-09 NOTE — Plan of Care (Signed)
  Problem: Clinical Measurements: Goal: Will remain free from infection Outcome: Progressing   Problem: Pain Managment: Goal: General experience of comfort will improve Outcome: Progressing   Problem: Safety: Goal: Ability to remain free from injury will improve Outcome: Progressing   Problem: Skin Integrity: Goal: Risk for impaired skin integrity will decrease Outcome: Progressing   

## 2023-01-09 NOTE — TOC Progression Note (Signed)
Transition of Care [facility]) - Progression Note    Patient Details  Name: [name] MRN: [id number] Date of Birth: [birth year]  [facility] of [facility]) CM/SW Contact  Marlowe Sax, RN Phone Number: 01/09/2023, 10:07 AM  Clinical Narrative:   The patient comes from home, He is employeed full time and has Ins NO TOC needs identified    Expected Discharge Plan: Home/Self Care Barriers to Discharge: No Barriers Identified  Expected Discharge Plan and Services       Living arrangements for the past 2 months: Single Family Home                 DME Arranged: N/A DME Agency: NA       HH Arranged: NA HH Agency: NA         Social Determinants of Health (SDOH) Interventions SDOH Screenings   Food Insecurity: No Food Insecurity (01/07/2023)  Housing: Low Risk  (01/07/2023)  Transportation Needs: No Transportation Needs (01/07/2023)  Utilities: Not At Risk (01/07/2023)  Tobacco Use: Medium Risk (01/08/2023)    Readmission Risk Interventions     No data to display
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
## 2023-01-10 LAB — CULTURE, BLOOD (ROUTINE X 2): Culture: NO GROWTH

## 2023-01-10 LAB — AEROBIC/ANAEROBIC CULTURE W GRAM STAIN (SURGICAL/DEEP WOUND)
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
## 2023-01-11 LAB — AEROBIC/ANAEROBIC CULTURE W GRAM STAIN (SURGICAL/DEEP WOUND)

## 2023-01-11 LAB — CULTURE, BLOOD (ROUTINE X 2): Special Requests: ADEQUATE

## 2023-01-12 LAB — CULTURE, BLOOD (ROUTINE X 2): Culture: NO GROWTH

## 2023-01-12 LAB — AEROBIC/ANAEROBIC CULTURE W GRAM STAIN (SURGICAL/DEEP WOUND)

## 2023-04-05 ENCOUNTER — Emergency Department
Admission: EM | Admit: 2023-04-05 | Discharge: 2023-04-05 | Disposition: A | Discharge status: Home / Self Care | Payer: 59 | Attending: Emergency Medicine | Admitting: Emergency Medicine

## 2023-04-05 ENCOUNTER — Other Ambulatory Visit: Payer: Self-pay

## 2023-04-05 DIAGNOSIS — K047 Periapical abscess without sinus: Secondary | ICD-10-CM | POA: Diagnosis not present

## 2023-04-05 DIAGNOSIS — K029 Dental caries, unspecified: Secondary | ICD-10-CM | POA: Insufficient documentation

## 2023-04-05 DIAGNOSIS — K0889 Other specified disorders of teeth and supporting structures: Secondary | ICD-10-CM | POA: Diagnosis not present

## 2023-04-05 MED ORDER — BENZOCAINE 10 % MT GEL: 1.0000 | OROMUCOSAL | 0 refills | Status: AC | PRN

## 2023-04-05 MED ORDER — BENZOCAINE 10 % MT GEL
Freq: Once | OROMUCOSAL | Status: AC
  Filled 2023-04-05: qty 9

## 2023-04-05 MED ORDER — AMOXICILLIN-POT CLAVULANATE 875-125 MG PO TABS: 1.0000 | ORAL_TABLET | Freq: Two times a day (BID) | ORAL | 0 refills | Status: AC

## 2023-04-05 NOTE — ED Provider Notes (Signed)
[facility] Provider Note    Event Date/Time   First MD Initiated Contact with Patient 04/05/23 1239     (approximate)   History   Dental Pain   HPI Todd Wright is a 32 y.o. male presenting today for dental pain.  He has had a couple weeks of sensitivity to heat of his left upper teeth.  He has plan to see a dentist in September.  He uses ice to help with pain symptoms.  Otherwise denies fever, chills, difficulty swallowing, difficulty breathing, pustular drainage, swelling to the face or neck.     Physical Exam   Triage Vital Signs: ED Triage Vitals  Encounter Vitals Group     BP 04/05/23 1237 (!) 121/90     Systolic BP Percentile --      Diastolic BP Percentile --      Pulse Rate 04/05/23 1237 83     Resp 04/05/23 1237 18     Temp --      Temp src --      SpO2 04/05/23 1237 99 %     Weight --      Height --      Head Circumference --      Peak Flow --      Pain Score 04/05/23 1238 6     Pain Loc --      Pain Education --      Exclude from Growth Chart --     Most recent vital signs: Vitals:   04/05/23 1237  BP: (!) 121/90  Pulse: 83  Resp: 18  SpO2: 99%   I have reviewed the vital signs. General:  Awake, alert, no acute distress. Head:  Normocephalic, Atraumatic. EENT:  PERRL, EOMI, Oral mucosa pink and moist, Neck is supple.  Multiple dental caries noted throughout mouth with 2 present in the left maxillary side.  No palpable abscess.  No pustular drainage.  No swelling to the face or neck. Cardiovascular: Regular rate, 2+ distal pulses. Respiratory:  Normal respiratory effort, symmetrical expansion, no distress.   Extremities:  Moving all four extremities through full ROM without pain.   Neuro:  Alert and oriented.  Interacting appropriately.   Skin:  Warm, dry, no rash.   Psych: Appropriate affect.     ED Results / Procedures / Treatments   Labs (all labs ordered are listed, but only abnormal results are  displayed) Labs Reviewed - No data to display   EKG    RADIOLOGY    PROCEDURES:  Critical Care performed: No  Procedures   MEDICATIONS ORDERED IN ED: Medications  benzocaine (ORAJEL) 10 % mucosal gel ( Mouth/Throat Given 04/05/23 1311)     IMPRESSION / MDM / ASSESSMENT AND PLAN / ED COURSE  I reviewed the triage vital signs and the nursing notes.                              Differential diagnosis includes, but is not limited to, dental caries, dental infection, less likely abscess, gingivitis.  Patient's presentation is most consistent with acute, uncomplicated illness.  Patient is a 32 year old male presenting today with dental pain.  Several dental caries noted in mouth with no obvious abscess.  No other swelling or more systemic symptoms.  Patient was given Orajel and will be discharged with antibiotics for possible dental infection prophylaxis until he can follow-up with his dentist for further care.  We also discussed  Orajel to the site to help with sensitivity.  Was given strict return precautions.     FINAL CLINICAL IMPRESSION(S) / ED DIAGNOSES   Final diagnoses:  Dental infection  Pain due to dental caries     Rx / DC Orders   ED Discharge Orders          Ordered    benzocaine (ORAJEL) 10 % mucosal gel  As needed        04/05/23 1313    amoxicillin-clavulanate (AUGMENTIN) 875-125 MG tablet  2 times daily        04/05/23 1314             Note:  This document was prepared using Dragon voice recognition software and may include unintentional dictation errors.   Janith Lima, MD 04/05/23 1316

## 2023-04-05 NOTE — Discharge Instructions (Addendum)
You were seen in the emergency department today for your dental pain.  I suspect, there is exposure of the nerves which is causing her sensitivity.  I have sent an antibiotic as well as a topical numbing medication (orajel) to your pharmacy to help treat pain symptoms.  Please follow-up with your dentist as planned.  Please return to the emergency department if you notice significant swelling to one side of your face, fevers, or difficulty swallowing or breathing.  I have provided an additional list of dentists in the area that may be able to see you faster.

## 2023-04-05 NOTE — ED Triage Notes (Signed)
Pt c/o right upper dental pain x1 month,. States he can only have ice. Pt has appointment w/ dentist in september.

## 2023-05-10 DIAGNOSIS — R634 Abnormal weight loss: Secondary | ICD-10-CM | POA: Diagnosis not present

## 2023-05-11 DIAGNOSIS — R634 Abnormal weight loss: Secondary | ICD-10-CM | POA: Diagnosis not present
